# Patient Record
Sex: Female | Born: 1965 | Race: Black or African American | Hispanic: No | Marital: Married | Smoking: Current every day smoker
Health system: Southern US, Community
[De-identification: ages and names within clinical notes are randomized; demographics above are authoritative.]

## PROBLEM LIST (undated history)

## (undated) DIAGNOSIS — F17200 Nicotine dependence, unspecified, uncomplicated: Secondary | ICD-10-CM

## (undated) DIAGNOSIS — D649 Anemia, unspecified: Secondary | ICD-10-CM

## (undated) DIAGNOSIS — K219 Gastro-esophageal reflux disease without esophagitis: Secondary | ICD-10-CM

## (undated) DIAGNOSIS — Z5189 Encounter for other specified aftercare: Secondary | ICD-10-CM

## (undated) HISTORY — DX: Anemia, unspecified: D64.9

## (undated) HISTORY — DX: Nicotine dependence, unspecified, uncomplicated: F17.200

## (undated) HISTORY — DX: Encounter for other specified aftercare: Z51.89

---
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
## 2005-09-20 ENCOUNTER — Emergency Department (HOSPITAL_COMMUNITY): Admission: EM | Admit: 2005-09-20 | Discharge: 2005-09-20 | Payer: Self-pay | Admitting: Family Medicine

## 2007-03-17 ENCOUNTER — Emergency Department (HOSPITAL_COMMUNITY): Admission: EM | Admit: 2007-03-17 | Discharge: 2007-03-17 | Payer: Self-pay | Admitting: Family Medicine

## 2010-05-28 ENCOUNTER — Emergency Department (HOSPITAL_COMMUNITY): Admission: EM | Admit: 2010-05-28 | Discharge: 2010-05-28 | Payer: Self-pay | Admitting: Family Medicine

## 2013-01-05 ENCOUNTER — Ambulatory Visit (INDEPENDENT_AMBULATORY_CARE_PROVIDER_SITE_OTHER): Payer: BC Managed Care – PPO | Admitting: Gynecology

## 2013-01-05 ENCOUNTER — Encounter: Payer: Self-pay | Admitting: Gynecology

## 2013-01-05 VITALS — BP 120/74 | HR 80 | Resp 18 | Ht 70.5 in | Wt 163.0 lb

## 2013-01-05 DIAGNOSIS — Z0000 Encounter for general adult medical examination without abnormal findings: Secondary | ICD-10-CM

## 2013-01-05 DIAGNOSIS — F17200 Nicotine dependence, unspecified, uncomplicated: Secondary | ICD-10-CM | POA: Insufficient documentation

## 2013-01-05 DIAGNOSIS — N920 Excessive and frequent menstruation with regular cycle: Secondary | ICD-10-CM

## 2013-01-05 DIAGNOSIS — D649 Anemia, unspecified: Secondary | ICD-10-CM

## 2013-01-05 DIAGNOSIS — Z01419 Encounter for gynecological examination (general) (routine) without abnormal findings: Secondary | ICD-10-CM

## 2013-01-05 LAB — POCT URINALYSIS DIPSTICK: Blood, UA: 2

## 2013-01-05 NOTE — Progress Notes (Signed)
47 y.o. Single African American female   [phone] here for annual exam. Pt is currently sexually active.  Pt reports bleeding since May 8th, started with normal cycle but has continued.  On onset changing pads q1H, clots cherry tomato size to small apple size.  Pt denies history of fibroids, denies history of sickle cell or thalassemia.  Cycles usually monthly.  Pt reports having month long cycle earlier this year but usually flow is 7d.  Pt denies bleeding after sex, currently not sexually active.  Pt denies any shortness of breath, fatigue at work-heavy lifting.  Pt is taking MVI and iron, 3-32m  Patient's last menstrual period was 11/30/2012.          Sexually active: yes  The current method of family planning is none.    Exercising: no  regularly Last pap: 4 years ago; has had a abnormal when she was a teenager. Alcohol: no Tobacco: 6-7 cigs/qd BSE: sometimes  Hgb: 5.1 Urine Blood 2 (on cycle)  ; Leuks 1    No health maintenance topics applied.  Family History  Problem Relation Age of Onset  . Hypertension Mother   . Heart Problems Father     Patient Active Problem List   Diagnosis Date Noted  . Smoker     Past Medical History  Diagnosis Date  . Smoker   . Anemia   . Blood transfusion without reported diagnosis     No past surgical history on file.  Allergies: Review of patient's allergies indicates no known allergies.  Current Outpatient Prescriptions  Medication Sig Dispense Refill  . IRON PO Take by mouth.      . Multiple Vitamins-Minerals (MULTIVITAMIN PO) Take by mouth.       No current facility-administered medications for this visit.    ROS: Pertinent items are noted in HPI.  Exam:    BP 120/74  Pulse 80  Resp 18  Ht 5' 10.5" (1.791 m)  Wt 163 lb (73.936 kg)  BMI 23.05 kg/m2  LMP 11/30/2012 Weight change: @WEIGHTCHANGE @ Last 3 height recordings:  Ht Readings from Last 3 Encounters:  01/05/13 5' 10.5" (1.791 m)   General appearance: alert,  cooperative and appears stated age Head: Normocephalic, without obvious abnormality, atraumatic Neck: no adenopathy, no carotid bruit, no JVD, supple, symmetrical, trachea midline and thyroid not enlarged, symmetric, no tenderness/mass/nodules Lungs: clear to auscultation bilaterally Breasts: normal appearance, no masses or tenderness Heart: regular rate and rhythm, S1, S2 normal, no murmur, click, rub or gallop Abdomen: soft, non-tender; bowel sounds normal; no masses,  no organomegaly Extremities: extremities normal, atraumatic, no cyanosis or edema Skin: Skin color, texture, turgor normal. No rashes or lesions Lymph nodes: Cervical, supraclavicular, and axillary nodes normal. no inguinal nodes palpated Neurologic: Grossly normal   Pelvic: External genitalia:  no lesions              Urethra: normal appearing urethra with no masses, tenderness or lesions              Bartholins and Skenes: normal                 Vagina: normal appearing vagina with normal color and discharge, no lesions              Cervix: normal appearance and scant blood              Pap taken: yes        Bimanual Exam:  Uterus:  uterus is normal size, shape,  consistency and nontender                                      Adnexa:    normal adnexa in size, nontender and no masses                                      Rectovaginal: Confirms                                      Anus:  normal sphincter tone, no lesions  A: menorrhagia, severe anemia Recommend proceeding with EMB today based on Hb although pt without sx.     P: EMB done-see note below mammogram pap smear with HRHPV return annually or prn   An After Visit Summary was printed and given to the patient.  Endometrial Biopsy Procedure Note   Procedure Details   The risks (including infection, bleeding, pain, and uterine perforation) and benefits of the procedure were addressed, consent given  cervix was cleansed with betadine, xylocaine jelly was  placed in endocervix and anterior lip.  The biopsy pipelle was advanced, without dilation.  Uterus sounded to 8cm, a large amount of tissue was obtained on single pass. Pt tolerated procedure well. Tissue to pathology.  The patient was advised to call for any fever or for prolonged or severe pain or bleeding. She was advised to use OTC pain relievers as needed for mild to moderate pain. She was advised to avoid vaginal intercourse for 48 hours or until the bleeding has completely stopped. We recommend pt continue her MVI and increase iron to BID, we will contact with results An after visit summary was provided to the patient.

## 2013-01-05 NOTE — Patient Instructions (Signed)

## 2013-01-06 LAB — CBC
HCT: 19.6 % — ABNORMAL LOW (ref 36.0–46.0)
MCV: 58 fL — ABNORMAL LOW (ref 78.0–100.0)
Platelets: 162 10*3/uL (ref 150–400)
RBC: 3.44 MIL/uL — ABNORMAL LOW (ref 3.87–5.11)
WBC: 5.6 10*3/uL (ref 4.0–10.5)

## 2013-01-06 LAB — VITAMIN B12: Vitamin B-12: 336 pg/mL (ref 211–911)

## 2013-01-06 LAB — IRON: Iron: <10 ug/dL — ABNORMAL LOW (ref 42–145)

## 2013-01-08 ENCOUNTER — Other Ambulatory Visit: Payer: Self-pay | Admitting: *Deleted

## 2013-01-09 LAB — IPS PAP TEST WITH HPV

## 2013-01-10 ENCOUNTER — Telehealth: Payer: Self-pay | Admitting: Orthopedic Surgery

## 2013-01-10 MED ORDER — METRONIDAZOLE 500 MG PO TABS: 500.0000 mg | ORAL_TABLET | Freq: Two times a day (BID) | ORAL | Status: DC

## 2013-01-10 MED ORDER — METOCLOPRAMIDE HCL 10 MG PO TABS: 10.0000 mg | ORAL_TABLET | Freq: Four times a day (QID) | ORAL | Status: DC | PRN

## 2013-01-10 NOTE — Telephone Encounter (Signed)
Call to pt to give Pap results. Pt at work and will take a break and call me back.

## 2013-01-10 NOTE — Telephone Encounter (Signed)
Spoke with pt about Pap results showing trichomonas infection. Instructed pt we would call in Flagyl 500 mg tablets and she would need to take 4 of them all at once. Pt also to take reglan, a nausea medicine, 30 minutes before taking the Flagyl. Pt can also take reglan every 6 hours afterwards. Pt to inform any partners of the infection so they can get treated. Pt to RTO in 2 weeks for TOC. Appt made for 01-22-13 at 11:45 for TOC. Answered questions about infection. Pt agreeable.

## 2013-01-10 NOTE — Telephone Encounter (Signed)
Message copied by Alfredo Batty on Wed Jan 10, 2013  9:36 AM ------      Message from: Lorraine Lax      Created: Tue Jan 09, 2013  3:01 PM       Routed to triage            02 recall 01/11/14 ------

## 2013-01-10 NOTE — Telephone Encounter (Signed)
Message copied by Alfredo Batty on Wed Jan 10, 2013 10:24 AM ------      Message from: Lorraine Lax      Created: Tue Jan 09, 2013  3:01 PM       Routed to triage            02 recall 01/11/14 ------

## 2013-01-11 ENCOUNTER — Encounter: Payer: Self-pay | Admitting: Gynecology

## 2013-01-12 LAB — IPS CERVICAL/ECC/EMB/VULVAR/VAGINAL BIOPSY

## 2013-01-18 ENCOUNTER — Telehealth: Payer: Self-pay | Admitting: Gynecology

## 2013-01-22 ENCOUNTER — Ambulatory Visit: Payer: BC Managed Care – PPO | Admitting: Gynecology

## 2013-01-23 NOTE — Telephone Encounter (Signed)
[facility] called to let Kennon Rounds know they are faxing this patients completed paperwork over.

## 2013-02-01 ENCOUNTER — Encounter: Payer: Self-pay | Admitting: Gynecology

## 2013-02-01 ENCOUNTER — Ambulatory Visit (INDEPENDENT_AMBULATORY_CARE_PROVIDER_SITE_OTHER): Payer: BC Managed Care – PPO | Admitting: Gynecology

## 2013-02-01 ENCOUNTER — Telehealth: Payer: Self-pay | Admitting: *Deleted

## 2013-02-01 VITALS — BP 108/60 | HR 66 | Resp 16 | Wt 163.0 lb

## 2013-02-01 DIAGNOSIS — Z202 Contact with and (suspected) exposure to infections with a predominantly sexual mode of transmission: Secondary | ICD-10-CM

## 2013-02-01 DIAGNOSIS — N920 Excessive and frequent menstruation with regular cycle: Secondary | ICD-10-CM

## 2013-02-01 DIAGNOSIS — Z2089 Contact with and (suspected) exposure to other communicable diseases: Secondary | ICD-10-CM

## 2013-02-01 DIAGNOSIS — D649 Anemia, unspecified: Secondary | ICD-10-CM

## 2013-02-01 MED ORDER — METRONIDAZOLE 500 MG PO TABS: 500.0000 mg | ORAL_TABLET | Freq: Two times a day (BID) | ORAL | Status: DC

## 2013-02-01 MED ORDER — METOCLOPRAMIDE HCL 10 MG PO TABS: 10.0000 mg | ORAL_TABLET | Freq: Four times a day (QID) | ORAL | Status: DC | PRN

## 2013-02-01 NOTE — Addendum Note (Signed)
Addended by: Lorraine Lax on: 02/01/2013 04:49 PM   Modules accepted: Orders

## 2013-02-01 NOTE — Patient Instructions (Signed)
Reexposure to trichomonas, partner needs to be treated as well Take reglan before flgyl 4 tablets at once

## 2013-02-01 NOTE — Telephone Encounter (Signed)
Return call to patient.  She states she has called has been approved with [employer] for $10 copay.  Given phone number to [facility] to arrange payment and shipment.  Instructed to call with menses so we can schedule injection, Menses is due in approx. two weeks.  Encouraged to continue iron supplement and discussed avoid taking iron with calcium rich foods.  Take with OJ if able to as Vit C can increase absorption.  Extra fluids and slow position changes. Advised she can call back if any additional assistance needed with Lupron.

## 2013-02-01 NOTE — Telephone Encounter (Signed)
Call to [facility] to check on status of Lupron order.  Transferred to [employer] and spoke to [place], [phone].  They have not received return call from patient with permission to ship.  Patient is here in office so she spoke to [place] and was told her copay for medication was $580 which patient can not afford.  She would also have to repeat that copay in 3 months. Patient was given information to call for copay assistance.  She will discuss options with dr Farrel Gobble.

## 2013-02-01 NOTE — Progress Notes (Signed)
Subjective:     Patient ID: Ebony Ward, female   DOB: April 25, 1966, 47 y.o.   MRN: [id number]  HPI Comments: Pt here for test of cure for trichomonal infection, pt tolerated flagyl after taking reglan pre-treatment.  Pt reports that her partner was not treated and that they had been sexually active since her treatment.  He is here with her and had questions regarding her diagnosis.   In addition, pt had not been in contact with [employer] regarding her Lupron, she is taking her iron as recommended and denies any symptoms of shortness of breath, fatigue or chest pain.  She has not had her menses since we initially saw her   Exposure to STD  The patient's pertinent negatives include no discharge, dyspareunia, dysuria, genital itching, genital lesions, genital rash or pelvic pain. Pertinent negatives include no abdominal pain, fever, genital odor or urinary frequency. The treatment provided mild relief. Risk factors include history of STDs.     Review of Systems  Constitutional: Negative for fever, activity change and fatigue.  Cardiovascular: Negative for chest pain.  Gastrointestinal: Negative for abdominal pain.  Genitourinary: Negative for dysuria, frequency, vaginal bleeding, pelvic pain and dyspareunia.       Objective:   Physical Exam  Constitutional: She is oriented to person, place, and time. She appears well-developed and well-nourished.  Neurological: She is alert and oriented to person, place, and time. She has normal reflexes.  Skin: Skin is warm and dry.       Assessment:     menorrhagia with severe anemia-asymptomatic Exposure to trichomonas     Plan:     Discussed with pt and boyfriend regarding symptoms and treatment for trichomonas, both partners need treatment and test of cure before resuming sexual activity, as they have had sex, we will retreat in lieu of test of cure, but she is to return for test of cure after 2w, she is agreeable.  Questions from both were  addressed Menorrhagia with anemia-pt will continue to take her iron, we will check a retic count and CBC at her follow up visit, we reviewed risks of severe anemia and asked pt to call if she develops symptoms, she is agreeable, pt is aware that transfusion is an option for her but is temporary fix.  We suggested she contact [employer] regarding assistance for the lupron as she cannot afford the co-pay, she also spoke with Kennon Rounds regarding this as well.   length of time spent discussing both STD's and anemia >73m,  >50% face to face

## 2013-02-01 NOTE — Telephone Encounter (Signed)
Patient calling to let us know she has "gotten the copay situation worked out." Needs to know how to proceed.

## 2013-02-02 ENCOUNTER — Telehealth: Payer: Self-pay | Admitting: *Deleted

## 2013-02-02 MED ORDER — METRONIDAZOLE 500 MG PO TABS: ORAL_TABLET | ORAL | Status: DC

## 2013-02-02 MED ORDER — METOCLOPRAMIDE HCL 10 MG PO TABS: ORAL_TABLET | ORAL | Status: DC

## 2013-02-02 NOTE — Addendum Note (Signed)
Addended by: Lorraine Lax on: 02/02/2013 08:32 AM   Modules accepted: Orders

## 2013-02-02 NOTE — Telephone Encounter (Signed)
Kennon Rounds . See note below . Call from Ms. Mack this morning concerning her Lupron. Dr. Farrel Gobble states aware of this also.

## 2013-02-02 NOTE — Telephone Encounter (Signed)
Patient called to let Dr. Farrel Gobble know she had contacted the company for her Lupron and was told she will have assistance with her co-pay concerning Lupron . States her co-pay for Lupron  now will be $10.00 instead of $500.00. States she was told that our office needed to call [facility] to have medicine shipped to our office.  Patient states she went to get her Rx 's that Dr. Farrel Gobble and her had talked about her pharmacy [facility] [address] in [place] and pharmacy told her would need to call our office to confirm directions on Rx before they could fill this.  Ms. Cherre Huger says to call her . She is anxious now to get Lupron due to her low iron and blood test results.

## 2013-02-02 NOTE — Addendum Note (Signed)
Addended by: Lorraine Lax on: 02/02/2013 09:16 AM   Modules accepted: Orders

## 2013-02-09 ENCOUNTER — Other Ambulatory Visit (INDEPENDENT_AMBULATORY_CARE_PROVIDER_SITE_OTHER): Payer: BC Managed Care – PPO

## 2013-02-09 ENCOUNTER — Telehealth: Payer: Self-pay | Admitting: *Deleted

## 2013-02-09 DIAGNOSIS — D649 Anemia, unspecified: Secondary | ICD-10-CM

## 2013-02-09 DIAGNOSIS — N920 Excessive and frequent menstruation with regular cycle: Secondary | ICD-10-CM

## 2013-02-09 LAB — CBC
Hemoglobin: 9.7 g/dL — ABNORMAL LOW (ref 12.0–15.0)
Platelets: 160 10*3/uL (ref 150–400)
RBC: 4.57 MIL/uL (ref 3.87–5.11)

## 2013-02-09 LAB — RETICULOCYTES
ABS Retic: 36.6 10*3/uL (ref 19.0–186.0)
RBC.: 4.57 MIL/uL (ref 3.87–5.11)
Retic Ct Pct: 0.8 % (ref 0.4–2.3)

## 2013-02-09 NOTE — Telephone Encounter (Signed)
Call to [facility] to follow up on shipment of Lupron medication.  Per Dondra Prader, they are ready to ship and medication should arrive 02-13-13.

## 2013-02-09 NOTE — Telephone Encounter (Signed)
See next phone note.

## 2013-02-09 NOTE — Telephone Encounter (Signed)
Patient notified that Lupron scheduled for delivery on Tuesday 02-13-13.  Menses due anytime, no contraception so knows to call with menses to schedule injection.

## 2013-02-13 ENCOUNTER — Telehealth: Payer: Self-pay | Admitting: *Deleted

## 2013-02-13 ENCOUNTER — Ambulatory Visit (INDEPENDENT_AMBULATORY_CARE_PROVIDER_SITE_OTHER): Payer: BC Managed Care – PPO

## 2013-02-13 ENCOUNTER — Ambulatory Visit (INDEPENDENT_AMBULATORY_CARE_PROVIDER_SITE_OTHER): Payer: BC Managed Care – PPO | Admitting: Gynecology

## 2013-02-13 DIAGNOSIS — N8300 Follicular cyst of ovary, unspecified side: Secondary | ICD-10-CM

## 2013-02-13 DIAGNOSIS — D649 Anemia, unspecified: Secondary | ICD-10-CM

## 2013-02-13 DIAGNOSIS — R9389 Abnormal findings on diagnostic imaging of other specified body structures: Secondary | ICD-10-CM

## 2013-02-13 DIAGNOSIS — N920 Excessive and frequent menstruation with regular cycle: Secondary | ICD-10-CM

## 2013-02-13 NOTE — Patient Instructions (Addendum)
Total Laparoscopic Hysterectomy A total laparoscopic hysterectomy is a minimally invasive surgery to remove your uterus and cervix. This surgery is performed by making several small cuts (incisions) in your abdomen. It can also be done with a thin, lighted tube (laparoscope) inserted into 2 small incisions in the lower abdomen. Your fallopian tubes and ovaries can be removed (bilateral salpingo-oopherectomy) during this surgery as well.If a total laparoscopic hysterectomy is started and it is not safe to continue, the laparoscopic surgery will be converted to an open abdominal surgery. You will not have menstrual periods or be able to get pregnant after having this surgery. If a bilateral salpingo-oopherectomy was performed before menopause, you will go through a sudden (abrupt) menopause. This can be helped with hormone medicines. Benefits of minimally invasive surgery include:  Less pain.  Less risk of blood loss.  Less risk of infection.  Quicker return to normal activities.  Usually a 1 night stay in the hospital.  Overall patient satisfaction. LET YOUR CAREGIVER KNOW ABOUT:  Any history of abnormal Pap tests.  Allergies to food or medicine.  Medicines taken, including vitamins, herbs, eyedrops, over-the-counter medicines, and creams.  Use of steroids (by mouth or creams).  Previous problems with anesthetics or numbing medicines.  Watch youtube robotic hysterectomy  History of bleeding problems or blood clots.  Previous surgery.  Other health problems, including diabetes and kidney problems.  Desire for future fertility.  Any infections or colds you may have developed.  Symptoms of irregular or heavy periods, weight loss, or urinary or bowel changes. RISKS AND COMPLICATIONS   Bleeding.  Blood clots in the legs or lung.  Infection.  Injury to surrounding organs.  Problems with anesthesia.  Early menopause symptoms (hot flashes, night sweats, insomnia).  Risk  of conversion to an open abdominal incision. BEFORE THE PROCEDURE  Ask your caregiver about changing or stopping your regular medicines.  Do not take aspirin or blood thinners (anticoagulants) for 1 week before the surgery, or as told by your caregiver.  Do not eat or drink anything for 8 hours before the surgery, or as told by your caregiver.  Quit smoking if you smoke.  Arrange for a ride home after surgery and for someone to help you at home during recovery. PROCEDURE   You will be given antibiotic medicine.  An intravenous (IV) line will be placed in your arm. You will be given medicine to make you sleep (general anesthetic).  A gas (carbon dioxide) will be used to inflate your abdomen. This will allow your surgeon to look inside your abdomen, perform your surgery, and treat any other problems found if necessary.  Three or four small incisions (often less than  inch) will be made in your abdomen. One of these incisions will be made in the area of your belly button (navel). The laparoscope will be inserted into the incision. Your surgeon will look through the laparoscope while doing your procedure.  Other surgical instruments will be inserted through the other incisions.  The uterus may be removed through the vagina or cut into small pieces and removed through the small incisions.  Your incisions will be closed. AFTER THE PROCEDURE  The gas will be released from inside your abdomen.  You will be taken to the recovery area where a nurse will watch and check your progress. Once you are awake, stable, and taking fluids well, without other problems, you will return to your room or be allowed to go home.  There is usually minimal  discomfort following the surgery because the incisions are so small.  You will be given pain medicine while you are in the hospital and for when you go home.  Try to have someone with you the first 3 to 5 days after you go home.  Follow up with your  surgeon in 2 to 4 weeks after surgery to evaluate your progress. Document Released: 05/09/2007 Document Revised: 10/04/2011 Document Reviewed: 02/26/2011 [facility] Patient Information 2014 [place], [place].

## 2013-02-13 NOTE — Telephone Encounter (Signed)
Message copied by Lorraine Lax on Tue Feb 13, 2013 10:21 AM ------      Message from: Douglass Rivers      Created: Sun Feb 11, 2013 11:07 AM       Having a fantastic response to the iron!  Keep it up for now ------

## 2013-02-13 NOTE — Progress Notes (Signed)
Pt here for u/s to evaluate uterus due to severe menorrhagia and significant anemia.  Hb ws 5.2 is now 9.7 after 1 month of iron and MVI.  Pt overall is feeling much better.  She has not had a menses since we first saw her but is due now.  We discussed the u/s findings, her uterus is remarkable only for her cs scar and a fundal echogenic focus that was noted to be avacular, endometrium is trilayered, ovaries were normal. we offered her a SHG based on the foci but she declines as we had a negative EMB and pt is planning on hysterectomy, she is starting to feel better with the improvement of her hb. We discussed perhaps not needing the lupron as she responded to the iron so well, I have asked her to call after the upcoming cycle and she is agreeable. We briefly discussed a robotic hysterectomy with her and she was given information and referred to youtube for video, I have asked her to come in for a pre-op consult and we can discuss any questions she may have at that time and she is agreeable.  We reviewed the anticipated time out out work based on her job and she would like to be able to schedule in September.  If her cycle is tolerable, we can consider placing her on progestins until surgery and she is agreeable Length of time discussing treatment 20m, >50% face to face

## 2013-02-13 NOTE — Telephone Encounter (Signed)
LM with female that order has arrive.

## 2013-02-13 NOTE — Telephone Encounter (Signed)
Left Message To Call Back regarding lab results.

## 2013-02-14 ENCOUNTER — Telehealth: Payer: Self-pay | Admitting: *Deleted

## 2013-02-14 NOTE — Telephone Encounter (Signed)
Call to patient to advise of estimated OOP cost for surgery, Female states she is not home. LMTCB.

## 2013-02-16 ENCOUNTER — Encounter: Payer: Self-pay | Admitting: Gynecology

## 2013-02-16 ENCOUNTER — Ambulatory Visit (INDEPENDENT_AMBULATORY_CARE_PROVIDER_SITE_OTHER): Payer: BC Managed Care – PPO | Admitting: Gynecology

## 2013-02-16 VITALS — BP 90/56 | HR 80 | Ht 70.5 in | Wt 165.0 lb

## 2013-02-16 DIAGNOSIS — A599 Trichomoniasis, unspecified: Secondary | ICD-10-CM

## 2013-02-16 LAB — POCT WET PREP (WET MOUNT)

## 2013-02-16 NOTE — Progress Notes (Signed)
Subjective:     Patient ID: Ebony Ward, female   DOB: 1966/05/21, 47 y.o.   MRN: [id number]  HPI Comments: Here for test of cure for trichomonas, boyfriend now has been treated, pt reports vaginal discharge is better, no odor noted, no fever or chills    Review of Systems  All other systems reviewed and are negative.       Objective:   Physical Exam  Constitutional: She is oriented to person, place, and time. She appears well-developed and well-nourished.  Genitourinary: Uterus normal. There is no rash on the left labia. Uterus is not tender. Cervix exhibits no motion tenderness, no discharge and no friability. Right adnexum displays no mass, no tenderness and no fullness. Left adnexum displays no mass, no tenderness and no fullness. No vaginal discharge found.  Neurological: She is alert and oriented to person, place, and time.   Wet prep done    Assessment:     Test of cure for trichomonas     Plan:     Minimal bacterial vaginosis- pt without symptoms, elects not to treat, will call if symptoms develop

## 2013-02-19 ENCOUNTER — Telehealth: Payer: Self-pay | Admitting: Gynecology

## 2013-02-19 NOTE — Telephone Encounter (Signed)
LMTCB to discuss insurance benefits for surgery.  °

## 2013-02-19 NOTE — Telephone Encounter (Signed)
Patient was notified at U/S visit 02/13/13

## 2013-02-20 NOTE — Telephone Encounter (Signed)
See next office visit note. Dr lathrop discussed this with patient.

## 2013-02-22 NOTE — Telephone Encounter (Signed)
Patient was returning call to [facility]. I spoke with her about her insurance benefits for surgery. Told her I would leave a message for Kennon Rounds. She wants to make sure that there is nothing else she needs to do other than wait for her cycle for her Lupron injection.

## 2013-03-14 NOTE — Telephone Encounter (Signed)
Patient is to return call . LMTRC on CB#VM to call office to let know how she is feeling. Is she still taking Iron ? Having any symptoms of anemia.?

## 2013-03-14 NOTE — Telephone Encounter (Signed)
Patient last OV 02/13/2013 Menorrhagia, PUS. Patient calling today to let Dr. Farrel Gobble know menses started and was told might need LUPRON injection. Patient states menses is still light and some abd. Cramping. Patient states she is still on Iron medication and feels much better. Please advise if still needing LUPRON injection to schedule to come in.

## 2013-03-14 NOTE — Telephone Encounter (Signed)
Patient was told to call when menses starts. Patient says she needs a shot.

## 2013-03-16 ENCOUNTER — Other Ambulatory Visit: Payer: Self-pay | Admitting: Gynecology

## 2013-03-16 ENCOUNTER — Ambulatory Visit (INDEPENDENT_AMBULATORY_CARE_PROVIDER_SITE_OTHER): Payer: BC Managed Care – PPO | Admitting: *Deleted

## 2013-03-16 VITALS — BP 130/68 | HR 82 | Resp 14 | Ht 70.0 in | Wt 166.0 lb

## 2013-03-16 DIAGNOSIS — N926 Irregular menstruation, unspecified: Secondary | ICD-10-CM

## 2013-03-16 DIAGNOSIS — N920 Excessive and frequent menstruation with regular cycle: Secondary | ICD-10-CM

## 2013-03-16 MED ORDER — LEUPROLIDE ACETATE 3.75 MG IM KIT: 3.7500 mg | PACK | INTRAMUSCULAR | Status: DC

## 2013-03-16 MED ORDER — LEUPROLIDE ACETATE (3 MONTH) 11.25 MG IM KIT
11.2500 mg | PACK | Freq: Once | INTRAMUSCULAR | Status: AC
  Administered 2013-03-16: 11.25 mg via INTRAMUSCULAR

## 2013-03-16 NOTE — Progress Notes (Signed)
Patient tolerated injection well and is aware to return in 3 months for her next Lupron. (see Documentation)

## 2013-03-16 NOTE — Progress Notes (Signed)
Pt called with menses, please give lupron today

## 2013-03-16 NOTE — Patient Instructions (Addendum)
Patient needs to make a appointment for 3 months for next Lupron

## 2013-03-16 NOTE — Progress Notes (Signed)
Patient coming in at 1:45 for Depo Lupron.

## 2013-04-06 ENCOUNTER — Telehealth: Payer: Self-pay | Admitting: *Deleted

## 2013-04-06 NOTE — Telephone Encounter (Signed)
Call to patient to discuss options for surgery dates. LMTCB.

## 2013-04-11 NOTE — Telephone Encounter (Signed)
See next OV note regarding Lupron.

## 2013-04-11 NOTE — Telephone Encounter (Signed)
Spoke to patient regarding surgical date options.  Patient really needs 3 weeks notice due to work.  Will call her back once conformed with hospital.  Currently scheduled for 05-09-13 but trying to move up 1 week.

## 2013-04-13 NOTE — Telephone Encounter (Signed)
Patient notified that surgery is scheduled for 05-09-17 at 0830 at [facility]. Instructions reviewed and pre/post op appts scheduled. Patient to get FMLA forms sent to office for completion. Gave the phone number to return call to PAT nurses.

## 2013-04-27 ENCOUNTER — Encounter: Payer: Self-pay | Admitting: Gynecology

## 2013-04-27 ENCOUNTER — Ambulatory Visit (INDEPENDENT_AMBULATORY_CARE_PROVIDER_SITE_OTHER): Payer: BC Managed Care – PPO | Admitting: Gynecology

## 2013-04-27 VITALS — BP 110/68 | HR 68 | Ht 70.5 in | Wt 169.0 lb

## 2013-04-27 DIAGNOSIS — N920 Excessive and frequent menstruation with regular cycle: Secondary | ICD-10-CM

## 2013-04-27 DIAGNOSIS — D649 Anemia, unspecified: Secondary | ICD-10-CM

## 2013-04-27 LAB — CBC
MCHC: 33.7 g/dL (ref 30.0–36.0)
MCV: 78.1 fL (ref 78.0–100.0)
Platelets: 193 10*3/uL (ref 150–400)
RDW: 18.4 % — ABNORMAL HIGH (ref 11.5–15.5)
WBC: 6.9 10*3/uL (ref 4.0–10.5)

## 2013-04-27 MED ORDER — HYDROMORPHONE HCL 2 MG PO TABS: 2.0000 mg | ORAL_TABLET | ORAL | Status: DC | PRN

## 2013-04-27 MED ORDER — CELECOXIB 200 MG PO CAPS: ORAL_CAPSULE | ORAL | Status: DC

## 2013-04-27 NOTE — Progress Notes (Signed)
47 y.o.SingleAfrican American [id number] female here for consideration for robotically assisted TLH  with Bilateral salpingectomy and without removal of ovaries.    She complains of menorrhagia. Pt treated with depo-lupron for menorrhagia and has responded well.  Last cycle very light but flowed for 1w.  Pt reorts energy is better. PUS on 02/14/2103 showed the uterus to be enlarged, 9.8x7.x5.5cm and contain 0 fibroids,  Adnexa Normal.  Endometrial biopsy on 01/06/2103 was PORTIONS OF NONPHASIC ENDOMETRIUM WITH BREAKDOWN  -NO HYPERPLASIA OR CARCINOMA IDENTIFIED        The current method of family planning is condoms    Hormones:lurpon   reports that she has been smoking Cigarettes.  She has been smoking about 0.00 packs per day. She has never used smokeless tobacco. She reports that she uses illicit drugs (Marijuana). She reports that she does not drink alcohol.  @CHLEC1NM@  Health Maintenance  Topic Date Due  . Tetanus/tdap  10/07/1986  . Influenza Vaccine  02/23/2013  . Pap Smear  01/06/2016    Family History  Problem Relation Age of Onset  . Hypertension Mother   . Heart Problems Father     Patient Active Problem List   Diagnosis Date Noted  . Menorrhagia 01/05/2013  . Anemia 01/05/2013  . Smoker     Past Medical History  Diagnosis Date  . Smoker   . Anemia   . Blood transfusion without reported diagnosis     History reviewed. No pertinent past surgical history.  Allergies: @RRALLERGIES@  Current Outpatient Prescriptions  Medication Sig Dispense Refill  . IRON PO Take by mouth.      . LUPRON DEPOT 11.25 MG injection Inject 11.25 mg into the muscle every 3 (three) months.       . Multiple Vitamins-Minerals (MULTIVITAMIN PO) Take by mouth.      . omeprazole (PRILOSEC) 20 MG capsule Take 20 mg by mouth as needed.       No current facility-administered medications for this visit.      Exam:    BP 110/68  Pulse 68  Ht 5' 10.5" (1.791 m)  Wt 169 lb (76.658 kg)   BMI 23.9 kg/m2  LMP 04/09/2013  General appearance: alert, cooperative and appears stated age Head: Normocephalic, without obvious abnormality, atraumatic Lungs: clear to auscultation bilaterally Heart: regular rate and rhythm, S1, S2 normal, no murmur, click, rub or gallop Abdomen: soft, non-tender; bowel sounds normal; no masses,  no organomegaly Extremities: extremities normal, atraumatic, no cyanosis or edema Lymph nodes: Cervical, supraclavicular, and axillary nodes normal. no inguinal nodes palpated Neurologic: Grossly normal   Pelvic: External genitalia:  no lesions              Bartholins and Skenes: normal                 Vagina: normal appearing vagina with normal color and discharge, no lesions              Cervix: normal appearance                     Bimanual Exam:  Uterus:  uterus is normal size, shape, consistency and nontender                                      Adnexa:    normal adnexa in size, nontender and no masses                                        Rectovaginal: Deferred                                      Anus:  defer exam   A/P:  The planned procedure was discussed with the patient.  Pre-op instructions, hospital course, and post-op instructions reviewed.  Post op instruction booklet given and reviewed.  Risks and possible complications discussed, including but not limited to, bleeding and possible transfusion; infection; anesthesia complications; injury to visceral organ requiring further surgery, either immediately or delayed; wound complications including infection, blood collections, and bowel herniation; allergic reactions; swelling of the face due to prolonged positioning;  VTE or air emboli; nerve injury related to prolonged positioning; even death.    The patient was given the opportunity to watch the informed consent video on hysterectomy.  Her questions were invited and answered.  The patient states she understands the risks and possible complications,  and wishes to proceed as planned.  Consent form signed.  She was instructed to take two 200 mg celebrex capsules with a small sip of water the morning of the procedure, and then to take one bid on POD 1 and 2.  Rx given dilaudid  2mg #20 for post-op Ready for surgery.   CBC today Encouraged to stop smoking Condom use stressed  

## 2013-04-27 NOTE — Patient Instructions (Signed)
Take 2xcelebrex the morning of surgery with sip of water Then take 1 pill twice a day Can mix with dilaudid for pain thereafter. No NSADIS-motrin, aleve or aspirin. Clear liquids the day before and suppository the night before and morning of

## 2013-05-03 ENCOUNTER — Encounter (HOSPITAL_COMMUNITY): Payer: Self-pay | Admitting: Pharmacist

## 2013-05-03 NOTE — Patient Instructions (Addendum)
Your procedure is scheduled on: 05/09/2013  Enter through the Main Entrance of [facility] at: 0700AM  Pick up the phone at the desk and dial [phone].  Call this number if you have problems the morning of surgery: [phone].  Remember: Do NOT eat food: AFTER MIDNIGHT 05/08/2013 Do NOT drink clear liquids after:AFTER MIDNIGHT 05/08/2013 Take these medicines the morning of surgery with a SIP OF WATER: OMEPRAZOLE  Do NOT wear jewelry (body piercing), make-up, or nail polish. Do NOT wear lotions, powders, or perfumes.  You may wear deoderant. Do NOT shave for 48 hours prior to surgery. Do NOT bring valuables to the hospital. Contacts, dentures, or bridgework may not be worn into surgery. Leave suitcase in car.  After surgery it may be brought to your room.  For patients admitted to the hospital, checkout time is 11:00 AM the day of discharge.

## 2013-05-04 ENCOUNTER — Encounter (HOSPITAL_COMMUNITY)
Admission: RE | Admit: 2013-05-04 | Discharge: 2013-05-04 | Disposition: A | Discharge status: Home / Self Care | Payer: BC Managed Care – PPO | Source: Ambulatory Visit | Attending: Gynecology | Admitting: Gynecology

## 2013-05-04 ENCOUNTER — Encounter (HOSPITAL_COMMUNITY): Payer: Self-pay

## 2013-05-04 DIAGNOSIS — Z01812 Encounter for preprocedural laboratory examination: Secondary | ICD-10-CM | POA: Insufficient documentation

## 2013-05-04 HISTORY — DX: Gastro-esophageal reflux disease without esophagitis: K21.9

## 2013-05-04 LAB — CBC
MCH: 26 pg (ref 26.0–34.0)
MCV: 80.5 fL (ref 78.0–100.0)
Platelets: 172 10*3/uL (ref 150–400)
RBC: 4.81 MIL/uL (ref 3.87–5.11)
RDW: 17.7 % — ABNORMAL HIGH (ref 11.5–15.5)
WBC: 6.5 10*3/uL (ref 4.0–10.5)

## 2013-05-04 LAB — TYPE AND SCREEN
ABO/RH(D): B POS
Antibody Screen: NEGATIVE

## 2013-05-04 LAB — ABO/RH: ABO/RH(D): B POS

## 2013-05-08 MED ORDER — DEXTROSE 5 % IV SOLN
2.0000 g | INTRAVENOUS | Status: AC
  Administered 2013-05-09: 2 g via INTRAVENOUS
  Filled 2013-05-08: qty 2

## 2013-05-08 MED ORDER — ACETAMINOPHEN 10 MG/ML IV SOLN
1000.0000 mg | Freq: Once | INTRAVENOUS | Status: DC
  Filled 2013-05-08: qty 100

## 2013-05-08 MED ORDER — ACETAMINOPHEN 10 MG/ML IV SOLN
1000.0000 mg | Freq: Once | INTRAVENOUS | Status: AC
  Administered 2013-05-09: 1000 mg via INTRAVENOUS
  Filled 2013-05-08: qty 100

## 2013-05-09 ENCOUNTER — Ambulatory Visit (HOSPITAL_COMMUNITY): Payer: BC Managed Care – PPO | Admitting: Anesthesiology

## 2013-05-09 ENCOUNTER — Encounter (HOSPITAL_COMMUNITY): Payer: Self-pay

## 2013-05-09 ENCOUNTER — Ambulatory Visit (HOSPITAL_COMMUNITY)
Admission: RE | Admit: 2013-05-09 | Discharge: 2013-05-10 | Disposition: A | Discharge status: Home / Self Care | Payer: BC Managed Care – PPO | Source: Ambulatory Visit | Attending: Gynecology | Admitting: Gynecology

## 2013-05-09 ENCOUNTER — Encounter (HOSPITAL_COMMUNITY): Payer: BC Managed Care – PPO | Admitting: Anesthesiology

## 2013-05-09 ENCOUNTER — Encounter (HOSPITAL_COMMUNITY)
Admission: RE | Disposition: A | Discharge status: Home / Self Care | Payer: Self-pay | Source: Ambulatory Visit | Attending: Gynecology

## 2013-05-09 DIAGNOSIS — N949 Unspecified condition associated with female genital organs and menstrual cycle: Secondary | ICD-10-CM

## 2013-05-09 DIAGNOSIS — N938 Other specified abnormal uterine and vaginal bleeding: Secondary | ICD-10-CM

## 2013-05-09 DIAGNOSIS — D649 Anemia, unspecified: Secondary | ICD-10-CM

## 2013-05-09 DIAGNOSIS — N838 Other noninflammatory disorders of ovary, fallopian tube and broad ligament: Secondary | ICD-10-CM | POA: Insufficient documentation

## 2013-05-09 DIAGNOSIS — F17200 Nicotine dependence, unspecified, uncomplicated: Secondary | ICD-10-CM | POA: Insufficient documentation

## 2013-05-09 DIAGNOSIS — Z9889 Other specified postprocedural states: Secondary | ICD-10-CM

## 2013-05-09 DIAGNOSIS — N920 Excessive and frequent menstruation with regular cycle: Secondary | ICD-10-CM | POA: Insufficient documentation

## 2013-05-09 DIAGNOSIS — D251 Intramural leiomyoma of uterus: Secondary | ICD-10-CM | POA: Insufficient documentation

## 2013-05-09 HISTORY — PX: BILATERAL SALPINGECTOMY: SHX5743

## 2013-05-09 HISTORY — PX: CYSTOSCOPY: SHX5120

## 2013-05-09 HISTORY — PX: ROBOTIC ASSISTED TOTAL HYSTERECTOMY: SHX6085

## 2013-05-09 LAB — TYPE AND SCREEN: Unit division: 0

## 2013-05-09 LAB — HCG, SERUM, QUALITATIVE: Preg, Serum: NEGATIVE

## 2013-05-09 SURGERY — ROBOTIC ASSISTED TOTAL HYSTERECTOMY
Anesthesia: General | Site: Bladder | Wound class: Clean Contaminated

## 2013-05-09 MED ORDER — PROPOFOL 10 MG/ML IV BOLUS
INTRAVENOUS | Status: DC | PRN
  Administered 2013-05-09: 200 mg via INTRAVENOUS

## 2013-05-09 MED ORDER — KETOROLAC TROMETHAMINE 30 MG/ML IJ SOLN
INTRAMUSCULAR | Status: AC
  Filled 2013-05-09: qty 1

## 2013-05-09 MED ORDER — CELECOXIB 200 MG PO CAPS
200.0000 mg | ORAL_CAPSULE | Freq: Two times a day (BID) | ORAL | Status: DC
  Administered 2013-05-10: 200 mg via ORAL
  Filled 2013-05-09: qty 1

## 2013-05-09 MED ORDER — INDIGOTINDISULFONATE SODIUM 8 MG/ML IJ SOLN
INTRAMUSCULAR | Status: DC | PRN
  Administered 2013-05-09: 3 mL via INTRAVENOUS

## 2013-05-09 MED ORDER — FENTANYL CITRATE 0.05 MG/ML IJ SOLN
INTRAMUSCULAR | Status: AC
  Filled 2013-05-09: qty 5

## 2013-05-09 MED ORDER — INFLUENZA VAC SPLIT QUAD 0.5 ML IM SUSP
0.5000 mL | INTRAMUSCULAR | Status: AC
  Administered 2013-05-10: 0.5 mL via INTRAMUSCULAR
  Filled 2013-05-09: qty 0.5

## 2013-05-09 MED ORDER — FENTANYL CITRATE 0.05 MG/ML IJ SOLN
INTRAMUSCULAR | Status: DC | PRN
  Administered 2013-05-09 (×4): 50 ug via INTRAVENOUS
  Administered 2013-05-09 (×3): 100 ug via INTRAVENOUS

## 2013-05-09 MED ORDER — SODIUM CHLORIDE 0.9 % IJ SOLN
INTRAMUSCULAR | Status: AC
  Filled 2013-05-09: qty 10

## 2013-05-09 MED ORDER — MENTHOL 3 MG MT LOZG: 1.0000 | LOZENGE | OROMUCOSAL | Status: DC | PRN

## 2013-05-09 MED ORDER — ROCURONIUM BROMIDE 50 MG/5ML IV SOLN
INTRAVENOUS | Status: AC
  Filled 2013-05-09: qty 1

## 2013-05-09 MED ORDER — ONDANSETRON HCL 4 MG/2ML IJ SOLN
INTRAMUSCULAR | Status: DC | PRN
  Administered 2013-05-09: 4 mg via INTRAMUSCULAR

## 2013-05-09 MED ORDER — SODIUM CHLORIDE 0.9 % IJ SOLN
INTRAMUSCULAR | Status: AC
  Filled 2013-05-09: qty 50

## 2013-05-09 MED ORDER — LACTATED RINGERS IR SOLN
Status: DC | PRN
  Administered 2013-05-09: 3000 mL

## 2013-05-09 MED ORDER — LACTATED RINGERS IV SOLN
INTRAVENOUS | Status: DC
  Administered 2013-05-09: 08:00:00 via INTRAVENOUS

## 2013-05-09 MED ORDER — HYDROMORPHONE HCL 2 MG PO TABS
2.0000 mg | ORAL_TABLET | ORAL | Status: DC | PRN
  Administered 2013-05-09 – 2013-05-10 (×2): 2 mg via ORAL
  Filled 2013-05-09 (×2): qty 1

## 2013-05-09 MED ORDER — ZOLPIDEM TARTRATE 5 MG PO TABS: 5.0000 mg | ORAL_TABLET | Freq: Every evening | ORAL | Status: DC | PRN

## 2013-05-09 MED ORDER — ROPIVACAINE HCL 5 MG/ML IJ SOLN
INTRAMUSCULAR | Status: DC | PRN
  Administered 2013-05-09: 30 mL via EPIDURAL

## 2013-05-09 MED ORDER — KETOROLAC TROMETHAMINE 30 MG/ML IJ SOLN
INTRAMUSCULAR | Status: DC | PRN
  Administered 2013-05-09: 30 mg via INTRAVENOUS

## 2013-05-09 MED ORDER — DOCUSATE SODIUM 100 MG PO CAPS
100.0000 mg | ORAL_CAPSULE | Freq: Two times a day (BID) | ORAL | Status: DC
  Administered 2013-05-09 – 2013-05-10 (×2): 100 mg via ORAL
  Filled 2013-05-09 (×2): qty 1

## 2013-05-09 MED ORDER — SODIUM CHLORIDE 0.9 % IJ SOLN
INTRAMUSCULAR | Status: DC | PRN
  Administered 2013-05-09: 60 mL via INTRAVENOUS

## 2013-05-09 MED ORDER — INDIGOTINDISULFONATE SODIUM 8 MG/ML IJ SOLN
INTRAMUSCULAR | Status: AC
  Filled 2013-05-09: qty 5

## 2013-05-09 MED ORDER — ROCURONIUM BROMIDE 100 MG/10ML IV SOLN
INTRAVENOUS | Status: DC | PRN
  Administered 2013-05-09: 10 mg via INTRAVENOUS
  Administered 2013-05-09: 50 mg via INTRAVENOUS
  Administered 2013-05-09 (×2): 20 mg via INTRAVENOUS

## 2013-05-09 MED ORDER — PNEUMOCOCCAL VAC POLYVALENT 25 MCG/0.5ML IJ INJ
0.5000 mL | INJECTION | INTRAMUSCULAR | Status: DC
  Filled 2013-05-09: qty 0.5

## 2013-05-09 MED ORDER — ONDANSETRON HCL 4 MG PO TABS: 4.0000 mg | ORAL_TABLET | Freq: Four times a day (QID) | ORAL | Status: DC | PRN

## 2013-05-09 MED ORDER — ROPIVACAINE HCL 5 MG/ML IJ SOLN
INTRAMUSCULAR | Status: AC
  Filled 2013-05-09: qty 30

## 2013-05-09 MED ORDER — HYDROMORPHONE HCL PF 1 MG/ML IJ SOLN
INTRAMUSCULAR | Status: AC
  Administered 2013-05-09: 0.5 mg via INTRAVENOUS
  Filled 2013-05-09: qty 1

## 2013-05-09 MED ORDER — NEOSTIGMINE METHYLSULFATE 1 MG/ML IJ SOLN
INTRAMUSCULAR | Status: DC | PRN
  Administered 2013-05-09: 4 mg via INTRAVENOUS

## 2013-05-09 MED ORDER — MIDAZOLAM HCL 2 MG/2ML IJ SOLN
INTRAMUSCULAR | Status: DC | PRN
  Administered 2013-05-09: 2 mg via INTRAVENOUS

## 2013-05-09 MED ORDER — ONDANSETRON HCL 4 MG/2ML IJ SOLN
INTRAMUSCULAR | Status: AC
  Filled 2013-05-09: qty 2

## 2013-05-09 MED ORDER — LIDOCAINE HCL (CARDIAC) 20 MG/ML IV SOLN
INTRAVENOUS | Status: DC | PRN
  Administered 2013-05-09: 50 mg via INTRAVENOUS

## 2013-05-09 MED ORDER — HYDROMORPHONE HCL PF 1 MG/ML IJ SOLN
0.2500 mg | INTRAMUSCULAR | Status: DC | PRN
  Administered 2013-05-09 (×2): 0.5 mg via INTRAVENOUS

## 2013-05-09 MED ORDER — ONDANSETRON HCL 4 MG/2ML IJ SOLN: 4.0000 mg | Freq: Four times a day (QID) | INTRAMUSCULAR | Status: DC | PRN

## 2013-05-09 MED ORDER — PROPOFOL 10 MG/ML IV EMUL
INTRAVENOUS | Status: AC
  Filled 2013-05-09: qty 20

## 2013-05-09 MED ORDER — LACTATED RINGERS IV SOLN
INTRAVENOUS | Status: DC
  Administered 2013-05-09 (×3): via INTRAVENOUS

## 2013-05-09 MED ORDER — HYDROMORPHONE HCL PF 1 MG/ML IJ SOLN: 0.2000 mg | INTRAMUSCULAR | Status: DC | PRN

## 2013-05-09 MED ORDER — KETOROLAC TROMETHAMINE 30 MG/ML IJ SOLN: 15.0000 mg | Freq: Once | INTRAMUSCULAR | Status: DC | PRN

## 2013-05-09 MED ORDER — MIDAZOLAM HCL 2 MG/2ML IJ SOLN
INTRAMUSCULAR | Status: AC
  Filled 2013-05-09: qty 2

## 2013-05-09 MED ORDER — STERILE WATER FOR IRRIGATION IR SOLN
Status: DC | PRN
  Administered 2013-05-09: 1000 mL

## 2013-05-09 MED ORDER — TRAMADOL HCL 50 MG PO TABS: 50.0000 mg | ORAL_TABLET | Freq: Four times a day (QID) | ORAL | Status: DC | PRN

## 2013-05-09 MED ORDER — SIMETHICONE 80 MG PO CHEW
160.0000 mg | CHEWABLE_TABLET | Freq: Four times a day (QID) | ORAL | Status: DC
  Administered 2013-05-09 – 2013-05-10 (×3): 160 mg via ORAL
  Filled 2013-05-09 (×3): qty 2

## 2013-05-09 MED ORDER — LACTATED RINGERS IV SOLN
INTRAVENOUS | Status: DC
  Administered 2013-05-09 – 2013-05-10 (×2): via INTRAVENOUS

## 2013-05-09 MED ORDER — GLYCOPYRROLATE 0.2 MG/ML IJ SOLN
INTRAMUSCULAR | Status: AC
  Filled 2013-05-09: qty 3

## 2013-05-09 MED ORDER — LIDOCAINE HCL (CARDIAC) 20 MG/ML IV SOLN
INTRAVENOUS | Status: AC
  Filled 2013-05-09: qty 5

## 2013-05-09 MED ORDER — NEOSTIGMINE METHYLSULFATE 1 MG/ML IJ SOLN
INTRAMUSCULAR | Status: AC
  Filled 2013-05-09: qty 1

## 2013-05-09 MED ORDER — PROMETHAZINE HCL 25 MG/ML IJ SOLN: 6.2500 mg | INTRAMUSCULAR | Status: DC | PRN

## 2013-05-09 MED ORDER — MEPERIDINE HCL 25 MG/ML IJ SOLN: 6.2500 mg | INTRAMUSCULAR | Status: DC | PRN

## 2013-05-09 MED ORDER — GLYCOPYRROLATE 0.2 MG/ML IJ SOLN
INTRAMUSCULAR | Status: DC | PRN
  Administered 2013-05-09: 0.6 mg via INTRAVENOUS

## 2013-05-09 SURGICAL SUPPLY — 73 items
APL SKNCLS STERI-STRIP NONHPOA (GAUZE/BANDAGES/DRESSINGS) ×3
BAG URINE DRAINAGE (UROLOGICAL SUPPLIES) ×4 IMPLANT
BARRIER ADHS 3X4 INTERCEED (GAUZE/BANDAGES/DRESSINGS) ×4 IMPLANT
BENZOIN TINCTURE PRP APPL 2/3 (GAUZE/BANDAGES/DRESSINGS) ×4 IMPLANT
BRR ADH 4X3 ABS CNTRL BYND (GAUZE/BANDAGES/DRESSINGS) ×3
CHLORAPREP W/TINT 26ML (MISCELLANEOUS) ×4 IMPLANT
CONT PATH 16OZ SNAP LID 3702 (MISCELLANEOUS) ×4 IMPLANT
COVER MAYO STAND STRL (DRAPES) ×4 IMPLANT
COVER TABLE BACK 60X90 (DRAPES) ×8 IMPLANT
COVER TIP SHEARS 8 DVNC (MISCELLANEOUS) ×3 IMPLANT
COVER TIP SHEARS 8MM DA VINCI (MISCELLANEOUS) ×1
DECANTER SPIKE VIAL GLASS SM (MISCELLANEOUS) ×4 IMPLANT
DRAPE HUG U DISPOSABLE (DRAPE) ×4 IMPLANT
DRAPE LG THREE QUARTER DISP (DRAPES) ×8 IMPLANT
DRAPE WARM FLUID 44X44 (DRAPE) ×4 IMPLANT
ELECT REM PT RETURN 9FT ADLT (ELECTROSURGICAL) ×4
ELECTRODE REM PT RTRN 9FT ADLT (ELECTROSURGICAL) ×3 IMPLANT
EVACUATOR SMOKE 8.L (FILTER) ×4 IMPLANT
GAUZE VASELINE 3X9 (GAUZE/BANDAGES/DRESSINGS) IMPLANT
GLOVE BIO SURGEON STRL SZ 6.5 (GLOVE) ×8 IMPLANT
GLOVE BIOGEL M 6.5 STRL (GLOVE) ×16 IMPLANT
GLOVE BIOGEL PI IND STRL 6.5 (GLOVE) ×6 IMPLANT
GLOVE BIOGEL PI INDICATOR 6.5 (GLOVE) ×2
GOWN STRL REIN XL XLG (GOWN DISPOSABLE) ×24 IMPLANT
GYRUS RUMI II 2.5CM BLUE (DISPOSABLE)
GYRUS RUMI II 3.5CM BLUE (DISPOSABLE)
GYRUS RUMI II 4.0CM BLUE (DISPOSABLE)
KIT ACCESSORY DA VINCI DISP (KITS) ×1
KIT ACCESSORY DVNC DISP (KITS) ×3 IMPLANT
LEGGING LITHOTOMY PAIR STRL (DRAPES) ×4 IMPLANT
NDL INSUFFLATION 14GA 120MM (NEEDLE) IMPLANT
NEEDLE INSUFFLATION 120MM (ENDOMECHANICALS) ×4 IMPLANT
NEEDLE INSUFFLATION 14GA 120MM (NEEDLE) ×4 IMPLANT
OCCLUDER COLPOPNEUMO (BALLOONS) ×1 IMPLANT
PACK LAVH (CUSTOM PROCEDURE TRAY) ×4 IMPLANT
PAD PREP 24X48 CUFFED NSTRL (MISCELLANEOUS) ×8 IMPLANT
PLUG CATH AND CAP STER (CATHETERS) ×4 IMPLANT
PROTECTOR NERVE ULNAR (MISCELLANEOUS) ×8 IMPLANT
RUMI II 3.0CM BLUE KOH-EFFICIE (DISPOSABLE) IMPLANT
RUMI II GYRUS 2.5CM BLUE (DISPOSABLE) IMPLANT
RUMI II GYRUS 3.5CM BLUE (DISPOSABLE) IMPLANT
RUMI II GYRUS 4.0CM BLUE (DISPOSABLE) IMPLANT
SET CYSTO W/LG BORE CLAMP LF (SET/KITS/TRAYS/PACK) ×4 IMPLANT
SET IRRIG TUBING LAPAROSCOPIC (IRRIGATION / IRRIGATOR) ×4 IMPLANT
SOLUTION ELECTROLUBE (MISCELLANEOUS) ×4 IMPLANT
STRIP CLOSURE SKIN 1/4X4 (GAUZE/BANDAGES/DRESSINGS) ×4 IMPLANT
SUT VIC AB 0 CT1 27 (SUTURE) ×8
SUT VIC AB 0 CT1 27XBRD ANBCTR (SUTURE) ×3 IMPLANT
SUT VIC AB 0 CT1 27XBRD ANTBC (SUTURE) IMPLANT
SUT VICRYL 0 UR6 27IN ABS (SUTURE) ×4 IMPLANT
SUT VICRYL RAPIDE 4/0 PS 2 (SUTURE) ×8 IMPLANT
SUT VLOC 180 0 9IN  GS21 (SUTURE)
SUT VLOC 180 0 9IN GS21 (SUTURE) IMPLANT
SYR 30ML LL (SYRINGE) ×4 IMPLANT
SYR 50ML LL SCALE MARK (SYRINGE) ×4 IMPLANT
SYRINGE 10CC LL (SYRINGE) ×4 IMPLANT
SYSTEM CONVERTIBLE TROCAR (TROCAR) ×4 IMPLANT
TIP RUMI ORANGE 6.7MMX12CM (TIP) IMPLANT
TIP UTERINE 5.1X6CM LAV DISP (MISCELLANEOUS) IMPLANT
TIP UTERINE 6.7X10CM GRN DISP (MISCELLANEOUS) IMPLANT
TIP UTERINE 6.7X6CM WHT DISP (MISCELLANEOUS) IMPLANT
TIP UTERINE 6.7X8CM BLUE DISP (MISCELLANEOUS) ×1 IMPLANT
TOWEL OR 17X24 6PK STRL BLUE (TOWEL DISPOSABLE) ×12 IMPLANT
TRAY FOLEY BAG SILVER LF 14FR (CATHETERS) ×4 IMPLANT
TROCAR 12M 150ML BLUNT (TROCAR) ×3 IMPLANT
TROCAR DILATING TIP 12MM 150MM (ENDOMECHANICALS) ×1 IMPLANT
TROCAR DISP BLADELESS 8 DVNC (TROCAR) ×3 IMPLANT
TROCAR DISP BLADELESS 8MM (TROCAR) ×1
TROCAR XCEL 12X100 BLDLESS (ENDOMECHANICALS) IMPLANT
TROCAR XCEL NON-BLD 5MMX100MML (ENDOMECHANICALS) ×4 IMPLANT
TUBING FILTER THERMOFLATOR (ELECTROSURGICAL) ×4 IMPLANT
WARMER LAPAROSCOPE (MISCELLANEOUS) ×4 IMPLANT
WATER STERILE IRR 1000ML POUR (IV SOLUTION) ×12 IMPLANT

## 2013-05-09 NOTE — Progress Notes (Signed)
Day of Surgery Procedure(s) (LRB): ROBOTIC ASSISTED TOTAL HYSTERECTOMY (N/A) CYSTOSCOPY (N/A) BILATERAL SALPINGECTOMY (Bilateral)  Subjective: Patient reports tolerating PO.    Objective: I have reviewed patient's vital signs, intake and output and medications.  General: alert, cooperative and appears stated age Resp: clear to auscultation bilaterally Cardio: regular rate and rhythm, S1, S2 normal, no murmur, click, rub or gallop GI: soft, non-tender; bowel sounds normal; no masses,  no organomegaly Extremities: extremities normal, atraumatic, no cyanosis or edema Vaginal Bleeding: none  Assessment: s/p Procedure(s): ROBOTIC ASSISTED TOTAL HYSTERECTOMY (N/A) CYSTOSCOPY (N/A) BILATERAL SALPINGECTOMY (Bilateral): stable and progressing well  Plan: Encourage ambulation continue current care  LOS: 0 days    Ebony Ward H  05/09/2013, 7:06 PM

## 2013-05-09 NOTE — H&P (View-Only) (Signed)
47 y.o.SingleAfrican American [id number] female here for consideration for robotically assisted TLH  with Bilateral salpingectomy and without removal of ovaries.    She complains of menorrhagia. Pt treated with depo-lupron for menorrhagia and has responded well.  Last cycle very light but flowed for 1w.  Pt reorts energy is better. PUS on 02/14/2103 showed the uterus to be enlarged, 9.8x7.x5.5cm and contain 0 fibroids,  Adnexa Normal.  Endometrial biopsy on 01/06/2103 was PORTIONS OF NONPHASIC ENDOMETRIUM WITH BREAKDOWN  -NO HYPERPLASIA OR CARCINOMA IDENTIFIED        The current method of family planning is condoms    Hormones:lurpon   reports that she has been smoking Cigarettes.  She has been smoking about 0.00 packs per day. She has never used smokeless tobacco. She reports that she uses illicit drugs (Marijuana). She reports that she does not drink alcohol.  @CHLEC1NM @  Health Maintenance  Topic Date Due  . Tetanus/tdap  10/07/1986  . Influenza Vaccine  02/23/2013  . Pap Smear  01/06/2016    Family History  Problem Relation Age of Onset  . Hypertension Mother   . Heart Problems Father     Patient Active Problem List   Diagnosis Date Noted  . Menorrhagia 01/05/2013  . Anemia 01/05/2013  . Smoker     Past Medical History  Diagnosis Date  . Smoker   . Anemia   . Blood transfusion without reported diagnosis     History reviewed. No pertinent past surgical history.  Allergies: @RRALLERGIES @  Current Outpatient Prescriptions  Medication Sig Dispense Refill  . IRON PO Take by mouth.      . LUPRON DEPOT 11.25 MG injection Inject 11.25 mg into the muscle every 3 (three) months.       . Multiple Vitamins-Minerals (MULTIVITAMIN PO) Take by mouth.      Marland Kitchen omeprazole (PRILOSEC) 20 MG capsule Take 20 mg by mouth as needed.       No current facility-administered medications for this visit.      Exam:    BP 110/68  Pulse 68  Ht 5' 10.5" (1.791 m)  Wt 169 lb (76.658 kg)   BMI 23.9 kg/m2  LMP 04/09/2013  General appearance: alert, cooperative and appears stated age Head: Normocephalic, without obvious abnormality, atraumatic Lungs: clear to auscultation bilaterally Heart: regular rate and rhythm, S1, S2 normal, no murmur, click, rub or gallop Abdomen: soft, non-tender; bowel sounds normal; no masses,  no organomegaly Extremities: extremities normal, atraumatic, no cyanosis or edema Lymph nodes: Cervical, supraclavicular, and axillary nodes normal. no inguinal nodes palpated Neurologic: Grossly normal   Pelvic: External genitalia:  no lesions              Bartholins and Skenes: normal                 Vagina: normal appearing vagina with normal color and discharge, no lesions              Cervix: normal appearance                     Bimanual Exam:  Uterus:  uterus is normal size, shape, consistency and nontender                                      Adnexa:    normal adnexa in size, nontender and no masses  Rectovaginal: Deferred                                      Anus:  defer exam   A/P:  The planned procedure was discussed with the patient.  Pre-op instructions, hospital course, and post-op instructions reviewed.  Post op instruction booklet given and reviewed.  Risks and possible complications discussed, including but not limited to, bleeding and possible transfusion; infection; anesthesia complications; injury to visceral organ requiring further surgery, either immediately or delayed; wound complications including infection, blood collections, and bowel herniation; allergic reactions; swelling of the face due to prolonged positioning;  VTE or air emboli; nerve injury related to prolonged positioning; even death.    The patient was given the opportunity to watch the informed consent video on hysterectomy.  Her questions were invited and answered.  The patient states she understands the risks and possible complications,  and wishes to proceed as planned.  Consent form signed.  She was instructed to take two 200 mg celebrex capsules with a small sip of water the morning of the procedure, and then to take one bid on POD 1 and 2.  Rx given dilaudid  2mg  #20 for post-op Ready for surgery.   CBC today Encouraged to stop smoking Condom use stressed

## 2013-05-09 NOTE — Anesthesia Preprocedure Evaluation (Signed)
Anesthesia Evaluation  Patient identified by MRN, date of birth, ID band Patient awake    Reviewed: Allergy & Precautions, H&P , NPO status , Patient's Chart, lab work & pertinent test results  Airway Mallampati: I TM Distance: >3 FB Neck ROM: full    Dental no notable dental hx. (+) Teeth Intact   Pulmonary Current Smoker,    Pulmonary exam normal       Cardiovascular negative cardio ROS      Neuro/Psych negative neurological ROS  negative psych ROS   GI/Hepatic Neg liver ROS, GERD-  Medicated and Controlled,  Endo/Other  negative endocrine ROS  Renal/GU negative Renal ROS     Musculoskeletal negative musculoskeletal ROS (+)   Abdominal Normal abdominal exam  (+)   Peds  Hematology negative hematology ROS (+)   Anesthesia Other Findings   Reproductive/Obstetrics negative OB ROS                           Anesthesia Physical Anesthesia Plan  ASA: II  Anesthesia Plan: General   Post-op Pain Management:    Induction: Intravenous  Airway Management Planned: Oral ETT  Additional Equipment:   Intra-op Plan:   Post-operative Plan:   Informed Consent: I have reviewed the patients History and Physical, chart, labs and discussed the procedure including the risks, benefits and alternatives for the proposed anesthesia with the patient or authorized representative who has indicated his/her understanding and acceptance.   Dental Advisory Given  Plan Discussed with: CRNA and Surgeon  Anesthesia Plan Comments:         Anesthesia Quick Evaluation

## 2013-05-09 NOTE — Anesthesia Procedure Notes (Signed)
Procedure Name: Intubation Date/Time: 05/09/2013 8:37 AM Performed by: Shanon Payor Pre-anesthesia Checklist: Patient identified, Emergency Drugs available, Suction available, Patient being monitored and Timeout performed Oxygen Delivery Method: Circle system utilized Preoxygenation: Pre-oxygenation with 100% oxygen Intubation Type: Combination inhalational/ intravenous induction Ventilation: Mask ventilation without difficulty Laryngoscope Size: Mac and 3 Grade View: Grade I Tube type: Oral Tube size: 7.0 mm Number of attempts: 1 Airway Equipment and Method: Stylet Placement Confirmation: ETT inserted through vocal cords under direct vision,  positive ETCO2 and breath sounds checked- equal and bilateral Secured at: 21 cm Tube secured with: Tape

## 2013-05-09 NOTE — Anesthesia Postprocedure Evaluation (Signed)
  Anesthesia Post-op Note  Patient: Ebony Ward  Procedure(s) Performed: Procedure(s): ROBOTIC ASSISTED TOTAL HYSTERECTOMY (N/A) CYSTOSCOPY (N/A) BILATERAL SALPINGECTOMY (Bilateral) Patient is awake and responsive. Pain and nausea are reasonably well controlled. Vital signs are stable and clinically acceptable. Oxygen saturation is clinically acceptable. There are no apparent anesthetic complications at this time. Patient is ready for discharge.

## 2013-05-09 NOTE — Transfer of Care (Signed)
Immediate Anesthesia Transfer of Care Note  Patient: Ebony Ward  Procedure(s) Performed: Procedure(s): ROBOTIC ASSISTED TOTAL HYSTERECTOMY (N/A) CYSTOSCOPY (N/A) BILATERAL SALPINGECTOMY (Bilateral)  Patient Location: PACU  Anesthesia Type:General  Level of Consciousness: awake, alert  and oriented  Airway & Oxygen Therapy: Patient Spontanous Breathing and Patient connected to nasal cannula oxygen  Post-op Assessment: Report given to PACU RN and Post -op Vital signs reviewed and stable  Post vital signs: Reviewed and stable  Complications: No apparent anesthesia complications

## 2013-05-09 NOTE — Interval H&P Note (Signed)
History and Physical Interval Note:  05/09/2013 8:28 AM  Ebony Ward  has presented today for surgery, with the diagnosis of menorrhagia,anemia CPT S2099 [phone]  The various methods of treatment have been discussed with the patient and family. After consideration of risks, benefits and other options for treatment, the patient has consented to  Procedure(s): ROBOTIC ASSISTED TOTAL HYSTERECTOMY (N/A) CYSTOSCOPY (N/A) BILATERAL SALPINGECTOMY (Bilateral) as a surgical intervention .  The patient's history has been reviewed, patient examined, no change in status, stable for surgery.  I have reviewed the patient's chart and labs.  Questions were answered to the patient's satisfaction.     Eann Cleland H

## 2013-05-10 ENCOUNTER — Encounter (HOSPITAL_COMMUNITY): Payer: Self-pay | Admitting: Gynecology

## 2013-05-10 LAB — CBC
MCHC: 32.6 g/dL (ref 30.0–36.0)
Platelets: 142 10*3/uL — ABNORMAL LOW (ref 150–400)
RBC: 4.16 MIL/uL (ref 3.87–5.11)
WBC: 8.2 10*3/uL (ref 4.0–10.5)

## 2013-05-10 NOTE — Op Note (Signed)
NAMESAHARA, Ebony Ward                   ACCOUNT NO.:  [id number]  MEDICAL RECORD NO.:  [id number]  LOCATION:  9302                          FACILITY:  [facility]  PHYSICIAN:  Ivor Costa. Farrel Gobble, M.D. DATE OF BIRTH:  02/28/1966  DATE OF PROCEDURE:  05/09/2013 DATE OF DISCHARGE:                              OPERATIVE REPORT   PREOPERATIVE DIAGNOSIS:  Menorrhagia with consumptive anemia.  POSTOPERATIVE DIAGNOSIS:  Menorrhagia with consumptive anemia.  PROCEDURE:  Robotic-assisted total hysterectomy.  Additional procedures were bilateral salpingectomy and cystoscopy.  SURGEON:  Ivor Costa. Farrel Gobble, MD  ASSISTANT:  Dollene Primrose. Edward Jolly, [place]  ANESTHESIA:  General.  IV FLUIDS:  2200 mL of lactated Ringer's.  URINE OUTPUT:  205.  ESTIMATED BLOOD LOSS:  100 mL.  INDICATIONS:  The patient is a 47 year old, G3, P3, with history of heavy menorrhagia who presented to the office complaining of fatigue. The patient was noted to have a hemoglobin of 5.  The patient had a negative EMB for pathology and was treated with Depo-Lupron in order to help bring up her blood count.  The patient responded well.  Her hemoglobin was now 12 and she presents for definitive surgery.  FINDINGS:  Normal-appearing uterus, tubes, and ovaries as well as cervix.  PATHOLOGY:  Uterus, tubes, ovaries, and cervix.  COMPLICATIONS:  None.  DESCRIPTION OF PROCEDURE:  The patient was taken to the operating room in the dorsal lithotomy position consistent with robotic surgery.  The bean bag was blown up.  Careful attention to flexion of the knees and positioning of the hands on that.  The patient was then prepped and draped in usual sterile fashion.  A bimanual exam was performed.  The orientation of the uterus was confirmed.  Sterile weighted speculum was placed in the vagina.  The cervix was visualized, stabilized with a single-tooth tenaculum.  The uterus sounded to 8.  Cervix was then dilated up to 21-French after which two stay  sutures were placed anterior and posterior with 0 Vicryl.  The cervix was measured at 3.5, and a large KoH ring was placed on the RUMI.  The RUMI was then advanced through the cervix into the fundus and blown up appropriately, complete mobilization of the uterus was confirmed.  Foley was placed.  Gloves were then changed and attention was turned to the abdomen where a supraumbilical incision was made with the scalpel.  The Veress needle was inserted, saline opening pressure was 2 and pneumoperitoneum was then created until tympany was appreciated about the liver after which, a #12 trocar was then advanced through the supraumbilical port placed in the abdomen and was confirmed.  Two 8 mm ports were then needed on left and right side.  10 cm from the midline port under direct visualization. An assistance port was then made in the right lower quadrant for a 5-mm trocar.  The patient was then positioned appropriately, robot was docked.  The tubes were noted to be mobile.  The ureters were identified bilaterally, markedly inferior to the infundibulopelvic ligament.  The tube was then deviated towards the midline and sharply dissected off after being treated with PK cautery.  The dissection carried through until  the uterus was reached, this was done bilaterally.  The tubo- ovarian ligament was then grasped, treated with PK cautery, and similarly transected sharply.  The posterior leaf of the broad ligament was incised.  The round ligament was identified, tented up, and treated with PK cautery, and then sharply incised.  The anterior leaf of the broad ligament was incised and a bladder flap was able to be created. There was some scarring noted in the midline from a prior C-section. Careful attention was taken to remove the bladder from the anterior uterus.  This procedure was similarly done bilaterally.  The uterine vessels were then skeletonized, and treated with PK cautery, they were however  not transected until both sides had been completed.  The dissection was carried through until the vessels were able to be completely dissected off the upper vagina.  The anterior colpotomy was then performed once we felt that the bladder was safely inferior to where the [facility] ring was palpable.  The anterior colpotomy was made with the Endo Shears, and dissected around from uterosacral to uterosacral. The uterus was then deflected and the posterior colpotomy was performed. The uterus was delivered with ease through the top of the vagina.  The vagina was then elevated and stabilized.  The vagina was plicated with Quill suture from the right coronary to the left making short imbricate and include the vaginal mucosa.  Once the left angle was achieved, the suture was then brought back stitches towards the midline.  The suture was cut and delivered through the 8 mm port.  At the beginning of the case, 60 mL ropivacaine had been injected into the pelvis, it was somewhat obscuring our operative view of the anterior cul-de-sac. Ropivacaine had been removed for better visualization.  At the end of the case, an additional 30 mL of ropivacaine was replaced back into the pelvis.  The operative site was inspected.  Hemostasis was assured.  The patient had been given indigo carmine, a cystoscopy was performed. There was no injury noted to the urethra.  The trigone was unremarkable. Copious blue urine was noted from each orifice.  There was no suture or thermal damage to the bladder neck up to the bladder dome and the bubble was noted.  The Foley was then replaced and attention was then turned back to the upper abdomen.  The ports were then removed under direct visualization.  The supraumbilical fascia was identified, tented up, and then plicated with 0 Vicryl.  The 3 larger ports was closed with Monocryl followed by Dermabond on all 4 ports.  The patient tolerated the procedure well.  Sponge, lap, needle  counts were correct x2.  She has been given IV Tylenol as well as Celebrex preoperatively, she also given cefotetan 2 g preoperatively and ropivacaine preoperatively.     Ivor Costa. Farrel Gobble, M.D.     THL/MEDQ  D:  05/10/2013  T:  05/10/2013  Job:  [id number]

## 2013-05-10 NOTE — Progress Notes (Signed)
1 Day Post-Op Procedure(s) (LRB): ROBOTIC ASSISTED TOTAL HYSTERECTOMY (N/A) CYSTOSCOPY (N/A) BILATERAL SALPINGECTOMY (Bilateral)  Subjective: Patient reports tolerating PO, + BM and no problems voiding.    Objective: I have reviewed patient's vital signs, intake and output, medications and labs.  General: alert, cooperative and appears stated age Resp: clear to auscultation bilaterally Cardio: regular rate and rhythm, S1, S2 normal, no murmur, click, rub or gallop GI: soft, non-tender; bowel sounds normal; no masses,  no organomegaly Extremities: extremities normal, atraumatic, no cyanosis or edema Vaginal Bleeding: none  Assessment: s/p Procedure(s): ROBOTIC ASSISTED TOTAL HYSTERECTOMY (N/A) CYSTOSCOPY (N/A) BILATERAL SALPINGECTOMY (Bilateral): stable  Plan: Discharge home  LOS: 1 day    Ebony Ward H 05/10/2013, 8:32 AM

## 2013-05-10 NOTE — Anesthesia Postprocedure Evaluation (Signed)
  Anesthesia Post-op Note  Patient: Ebony Ward  Procedure(s) Performed: Procedure(s): ROBOTIC ASSISTED TOTAL HYSTERECTOMY (N/A) CYSTOSCOPY (N/A) BILATERAL SALPINGECTOMY (Bilateral)  Patient Location: [facility]  Anesthesia Type:General  Level of Consciousness: awake, alert  and oriented  Airway and Oxygen Therapy: Patient Spontanous Breathing  Post-op Pain: none  Post-op Assessment: Post-op Vital signs reviewed and Patient's Cardiovascular Status Stable  Post-op Vital Signs: Reviewed and stable  Complications: No apparent anesthesia complications

## 2013-05-10 NOTE — Progress Notes (Signed)
Patient discharged to home with her partner. Condition stable. Patient ambulated to car with Kenney Houseman, RN. No equipment ordered for home at discharge. Patient reports abdominal pain improved.

## 2013-05-10 NOTE — Brief Op Note (Signed)
05/09/2013  8:51 AM  PATIENT:  Ebony Ward  47 y.o. female  PRE-OPERATIVE DIAGNOSIS:  menorrhagia,anemia CPT S2099 [id number]  POST-OPERATIVE DIAGNOSIS:  menorrhagia,anemia CPT S2099 [id number]  PROCEDURE:  Procedure(s): ROBOTIC ASSISTED TOTAL HYSTERECTOMY (N/A) CYSTOSCOPY (N/A) BILATERAL SALPINGECTOMY (Bilateral)  SURGEON:  Surgeon(s) and Role:    * Bennye Alm, MD - Primary    * Melony Overly, MD - Assisting  PHYSICIAN ASSISTANT:   ASSISTANTS: none   ANESTHESIA:   general  EBL:  Total I/O In: -  Out: 450 [Urine:450]  BLOOD ADMINISTERED:none  DRAINS: none   LOCAL MEDICATIONS USED:  Amount: 90 ml and OTHER ropivicaine-dilute  SPECIMEN:  Source of Specimen:  uterus, tubes  DISPOSITION OF SPECIMEN:  PATHOLOGY  COUNTS:  YES  TOURNIQUET:  * No tourniquets in log *  DICTATION: .Other Dictation: Dictation Number [id number]  PLAN OF CARE: Admit for overnight observation  PATIENT DISPOSITION:  Short Stay   Delay start of Pharmacological VTE agent (>24hrs) due to surgical blood loss or risk of bleeding: not applicable

## 2013-05-16 ENCOUNTER — Ambulatory Visit (INDEPENDENT_AMBULATORY_CARE_PROVIDER_SITE_OTHER): Payer: BC Managed Care – PPO | Admitting: Gynecology

## 2013-05-16 VITALS — BP 102/72 | HR 64 | Resp 14 | Ht 70.5 in | Wt 170.0 lb

## 2013-05-16 DIAGNOSIS — D649 Anemia, unspecified: Secondary | ICD-10-CM

## 2013-05-16 DIAGNOSIS — N920 Excessive and frequent menstruation with regular cycle: Secondary | ICD-10-CM

## 2013-05-16 NOTE — Progress Notes (Signed)
Subjective:     Ebony Ward is a 47 y.o. female who presents to the clinic 1 weeks status post robotic hysterectomy with bilateral sapingectomy for abnormal uterine bleeding and anemia. Eating a regular diet without difficulty. Bowel movements are normal. The patient is not having any pain. No bleeding, took only 1 dilaudid today.  The following portions of the patient's history were reviewed and updated as appropriate: allergies, current medications, past family history, past medical history, past social history, past surgical history and problem list.  Review of Systems Pertinent items are noted in HPI.    Objective:    LMP 04/09/2013 General:  alert, cooperative and appears stated age  Abdomen: soft, bowel sounds active, non-tender  Incision:   healing well, no drainage, no erythema, no hernia, no seroma, no swelling, no dehiscence, incision well approximated     Assessment:    Doing well postoperatively. Operative findings again reviewed. Pathology report discussed.    Plan:    1. Continue any current medications. 2. Wound care discussed. 3. Activity restrictions: no repetitive motion and no sex, can drive once on no pain meds and can hit the brake 4. Anticipated return to work: 4 weeks.

## 2013-06-15 ENCOUNTER — Encounter: Payer: Self-pay | Admitting: Gynecology

## 2013-06-15 ENCOUNTER — Ambulatory Visit (INDEPENDENT_AMBULATORY_CARE_PROVIDER_SITE_OTHER): Payer: BC Managed Care – PPO | Admitting: Gynecology

## 2013-06-15 ENCOUNTER — Ambulatory Visit: Payer: BC Managed Care – PPO

## 2013-06-15 VITALS — BP 120/78 | HR 72 | Resp 18 | Ht 70.5 in | Wt 176.0 lb

## 2013-06-15 DIAGNOSIS — D649 Anemia, unspecified: Secondary | ICD-10-CM

## 2013-06-15 DIAGNOSIS — Z9889 Other specified postprocedural states: Secondary | ICD-10-CM

## 2013-06-15 LAB — CBC
HCT: 35.3 % — ABNORMAL LOW (ref 36.0–46.0)
MCHC: 34.6 g/dL (ref 30.0–36.0)
MCV: 79.7 fL (ref 78.0–100.0)
RDW: 15 % (ref 11.5–15.5)

## 2013-06-15 NOTE — Patient Instructions (Signed)
May return to work without restictions

## 2013-06-15 NOTE — Progress Notes (Signed)
Subjective:     Patient ID: Ebony Ward, female   DOB: 10/02/1965, 47 y.o.   MRN: [id number]  HPI Comments: Pt here 6w post-op s/p robotic hysterecomy for DUB and severe anemia.  Pt is without complaints, she denies constipation , vaginal bleeding, fatigue,  Pt is eager to return back to work.  Pt has not been sexually active. Pt denies any hot flashes.    Review of Systems  Constitutional: Negative for fatigue.  Gastrointestinal: Negative for diarrhea and constipation.  Genitourinary: Negative for vaginal bleeding, vaginal discharge, difficulty urinating, vaginal pain and pelvic pain.       Objective:   Physical Exam  Constitutional: She is oriented to person, place, and time. She appears well-developed and well-nourished.  Neurological: She is alert and oriented to person, place, and time.  Pelvic exam: VULVA: normal appearing vulva with no masses, tenderness or lesions, VAGINA: normal appearing vagina with normal color and discharge, no lesions, CERVIX: surgically absent, UTERUS: surgically absent, vaginal cuff well healed, ADNEXA: normal adnexa in size, nontender and no masses.       Assessment:     5w post-op doing well     Plan:     May return to work without restictions Check CBC Continue pelvic rest for 8w post-op

## 2013-06-18 ENCOUNTER — Telehealth: Payer: Self-pay | Admitting: *Deleted

## 2013-06-18 NOTE — Telephone Encounter (Signed)
Message copied by Lorraine Lax on Mon Jun 18, 2013 10:57 AM ------      Message from: Douglass Rivers      Created: Sun Jun 17, 2013  8:41 PM       Inform back to presurgical levels, can probably take just a multivitamin at this point ------

## 2013-06-18 NOTE — Telephone Encounter (Signed)
Left Message To Call Back  

## 2013-06-19 NOTE — Telephone Encounter (Signed)
Patient notified see labs 

## 2013-11-01 ENCOUNTER — Other Ambulatory Visit: Payer: Self-pay | Admitting: Gynecology

## 2013-11-02 NOTE — Telephone Encounter (Signed)
Last AEX 01/05/2013 Last refill 02/02/2013 #4/ 0 refills Next appt 01/11/2014  Please approve or deny Rx.

## 2013-11-02 NOTE — Telephone Encounter (Signed)
-  Called patient and she states she hasn't requested any med. - Rx request denied.

## 2013-11-06 ENCOUNTER — Telehealth: Payer: Self-pay | Admitting: Emergency Medicine

## 2013-11-06 NOTE — Telephone Encounter (Signed)
Message copied by Ebert Forrester, Trellis Paganini on Tue Nov 06, 2013 11:14 AM ------      Message from: Elveria Rising      Created: Mon Nov 05, 2013  6:14 PM       i had gotten a rx request for flagyl that she used to treat trich, so if she thinks she has another infection, she needs to be seen      TL      ----- Message -----         From: Karen Chafe, RN         Sent: 11/02/2013   4:52 PM           To: Azalia Bilis, MD            For surgical follow up? I just want to know what you would like to see her for, for when I call.       ----- Message -----         From: Azalia Bilis, MD         Sent: 11/02/2013   2:52 PM           To: Karen Chafe, RN            Pls inform pt needs appt             ------

## 2013-11-06 NOTE — Telephone Encounter (Signed)
Message left to return call to Bobbie Valletta at [phone].    

## 2013-11-08 NOTE — Telephone Encounter (Signed)
Left message to call Kaitlyn at [phone]. 

## 2013-11-13 NOTE — Telephone Encounter (Signed)
Dialed [phone] in attempt to reach patient. Advised by person who answered call "You have the wrong number." and was hung up on. Number correct per release of information form. Called home number [phone] and received message "Your phone number can not be completed as dial." Tried x2.  Dr.Lathrop, have tried to reach patient multiple times at multiple numbers she provided with no return call and not able to get through. Okay to close encounter?

## 2013-11-13 NOTE — Telephone Encounter (Signed)
yes

## 2013-12-06 ENCOUNTER — Encounter: Payer: Self-pay | Admitting: Gynecology

## 2014-01-11 ENCOUNTER — Ambulatory Visit (INDEPENDENT_AMBULATORY_CARE_PROVIDER_SITE_OTHER): Payer: BC Managed Care – PPO | Admitting: Gynecology

## 2014-01-11 ENCOUNTER — Encounter: Payer: Self-pay | Admitting: Gynecology

## 2014-01-11 VITALS — BP 122/74 | HR 76 | Ht 70.5 in | Wt 193.0 lb

## 2014-01-11 DIAGNOSIS — Z0000 Encounter for general adult medical examination without abnormal findings: Secondary | ICD-10-CM

## 2014-01-11 DIAGNOSIS — Z01419 Encounter for gynecological examination (general) (routine) without abnormal findings: Secondary | ICD-10-CM

## 2014-01-11 LAB — POCT URINALYSIS DIPSTICK
BILIRUBIN UA: NEGATIVE
Blood, UA: NEGATIVE
Glucose, UA: NEGATIVE
Ketones, UA: NEGATIVE
LEUKOCYTES UA: NEGATIVE
NITRITE UA: NEGATIVE
Protein, UA: NEGATIVE
Urobilinogen, UA: NEGATIVE
pH, UA: 6

## 2014-01-11 LAB — HEMOGLOBIN, FINGERSTICK: HEMOGLOBIN, FINGERSTICK: 13.5 g/dL (ref 12.0–16.0)
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
## 2014-05-27 ENCOUNTER — Encounter: Payer: Self-pay | Admitting: Gynecology

## 2014-06-26 ENCOUNTER — Telehealth: Payer: Self-pay | Admitting: Gynecology

## 2014-06-26 NOTE — Telephone Encounter (Signed)
LMTCB about canceled appointment °

## 2014-08-20 ENCOUNTER — Encounter: Payer: Self-pay | Admitting: Obstetrics & Gynecology

## 2014-11-29 ENCOUNTER — Encounter (HOSPITAL_COMMUNITY): Payer: Self-pay | Admitting: Emergency Medicine

## 2014-11-29 ENCOUNTER — Emergency Department (HOSPITAL_COMMUNITY): Payer: BLUE CROSS/BLUE SHIELD

## 2014-11-29 ENCOUNTER — Emergency Department (HOSPITAL_COMMUNITY)
Admission: EM | Admit: 2014-11-29 | Discharge: 2014-11-30 | Disposition: A | Discharge status: Home / Self Care | Payer: BLUE CROSS/BLUE SHIELD | Attending: Emergency Medicine | Admitting: Emergency Medicine

## 2014-11-29 DIAGNOSIS — Y9289 Other specified places as the place of occurrence of the external cause: Secondary | ICD-10-CM | POA: Insufficient documentation

## 2014-11-29 DIAGNOSIS — Y998 Other external cause status: Secondary | ICD-10-CM | POA: Insufficient documentation

## 2014-11-29 DIAGNOSIS — S0081XA Abrasion of other part of head, initial encounter: Secondary | ICD-10-CM

## 2014-11-29 DIAGNOSIS — Z79899 Other long term (current) drug therapy: Secondary | ICD-10-CM | POA: Diagnosis not present

## 2014-11-29 DIAGNOSIS — S60512A Abrasion of left hand, initial encounter: Secondary | ICD-10-CM | POA: Insufficient documentation

## 2014-11-29 DIAGNOSIS — S0993XA Unspecified injury of face, initial encounter: Secondary | ICD-10-CM | POA: Diagnosis present

## 2014-11-29 DIAGNOSIS — Z720 Tobacco use: Secondary | ICD-10-CM | POA: Diagnosis not present

## 2014-11-29 DIAGNOSIS — S0031XA Abrasion of nose, initial encounter: Secondary | ICD-10-CM | POA: Insufficient documentation

## 2014-11-29 DIAGNOSIS — Y9389 Activity, other specified: Secondary | ICD-10-CM | POA: Insufficient documentation

## 2014-11-29 DIAGNOSIS — S80211A Abrasion, right knee, initial encounter: Secondary | ICD-10-CM | POA: Diagnosis not present

## 2014-11-29 MED ORDER — OXYCODONE-ACETAMINOPHEN 5-325 MG PO TABS
1.0000 | ORAL_TABLET | Freq: Once | ORAL | Status: AC
  Administered 2014-11-29: 1 via ORAL
  Filled 2014-11-29: qty 1
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
## 2014-11-29 NOTE — ED Notes (Signed)
Pt had taken ice pack off of knee. Stated that she did not want another ice pack.

## 2014-11-30 ENCOUNTER — Emergency Department (HOSPITAL_COMMUNITY): Payer: BLUE CROSS/BLUE SHIELD

## 2014-11-30 MED ORDER — BACITRACIN ZINC 500 UNIT/GM EX OINT: 1.0000 "application " | TOPICAL_OINTMENT | Freq: Two times a day (BID) | CUTANEOUS | Status: AC

## 2014-11-30 MED ORDER — LIDOCAINE-EPINEPHRINE 2 %-1:100000 IJ SOLN
20.0000 mL | Freq: Once | INTRAMUSCULAR | Status: AC
  Administered 2014-11-30: 20 mL
  Filled 2014-11-30: qty 1

## 2014-11-30 MED ORDER — OXYCODONE-ACETAMINOPHEN 5-325 MG PO TABS: 1.0000 | ORAL_TABLET | Freq: Four times a day (QID) | ORAL | Status: AC | PRN

## 2014-11-30 NOTE — ED Provider Notes (Signed)
LACERATION REPAIR Performed by: Garald Balding Authorized by: Garald Balding Consent: Verbal consent obtained. Risks and benefits: risks, benefits and alternatives were discussed Consent given by: patient Patient identity confirmed: provided demographic data Prepped and Draped in normal sterile fashion Wound explored , irrigated with 250 cc of normal saline  Laceration Location: knee  Laceration Length: 1 cm  No Foreign Bodies seen or palpated  Anesthesia: local infiltration  Local anesthetic: lidocaine 1 without epinephrine  Anesthetic total: 20ml  Irrigation method: syringe Amount of cleaning: standard  Fascia closure  simple  Number of sutures: 6  Technique: Interrupted   Patient tolerance: Patient tolerated the procedure well with no immediate complications.  Junius Creamer, NP 11/30/14 [place], MD 11/30/14 [phone]
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
## 2014-11-30 NOTE — ED Notes (Signed)
Wet to Dry dressing applied to wound, wrapped in coban.

## 2014-12-31 ENCOUNTER — Encounter: Payer: Self-pay | Admitting: Gynecology

## 2015-01-17 ENCOUNTER — Ambulatory Visit: Payer: BC Managed Care – PPO | Admitting: Nurse Practitioner

## 2015-01-17 ENCOUNTER — Ambulatory Visit: Payer: BC Managed Care – PPO | Admitting: Gynecology

## 2015-06-25 ENCOUNTER — Other Ambulatory Visit: Payer: Self-pay | Admitting: Family

## 2015-06-25 DIAGNOSIS — R1012 Left upper quadrant pain: Secondary | ICD-10-CM

## 2015-07-02 ENCOUNTER — Other Ambulatory Visit: Payer: Self-pay

## 2015-07-11 ENCOUNTER — Ambulatory Visit
Admission: RE | Admit: 2015-07-11 | Discharge: 2015-07-11 | Disposition: A | Discharge status: Home / Self Care | Payer: BLUE CROSS/BLUE SHIELD | Source: Ambulatory Visit | Attending: Family | Admitting: Family

## 2015-07-11 DIAGNOSIS — R1012 Left upper quadrant pain: Secondary | ICD-10-CM

## 2016-07-21 IMAGING — US US ABDOMEN COMPLETE
1 series · 14 of 25 positions shown · non-contrast
Comparison: None.

CLINICAL DATA: Left upper quadrant pain, nausea, vomiting

EXAM:
ULTRASOUND ABDOMEN COMPLETE

[Series 1: us abdomen complete · 0.30mm/px · 14 of 90 slices shown]
[im 1/90]
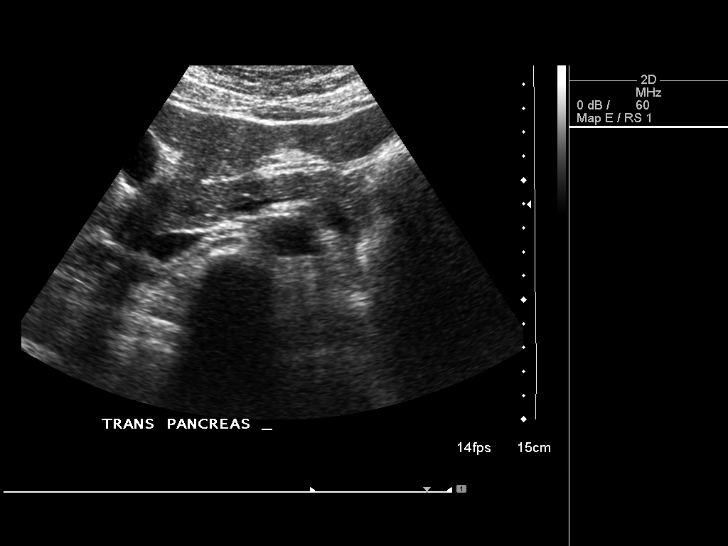
[im 8/90]
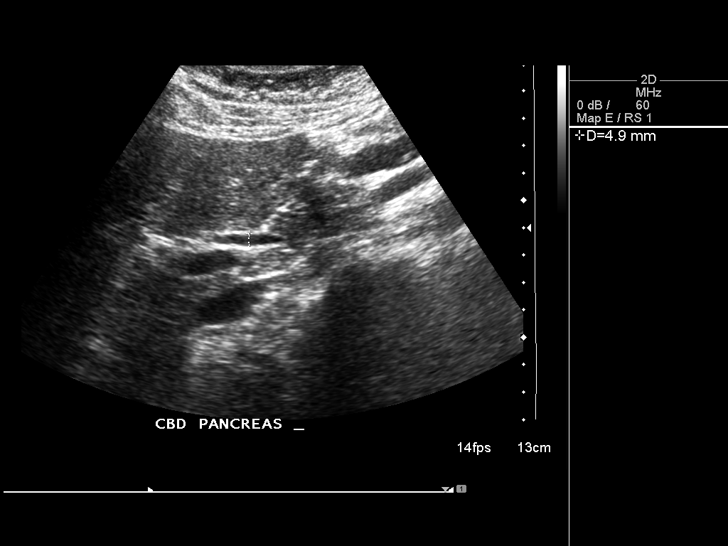
[im 15/90]
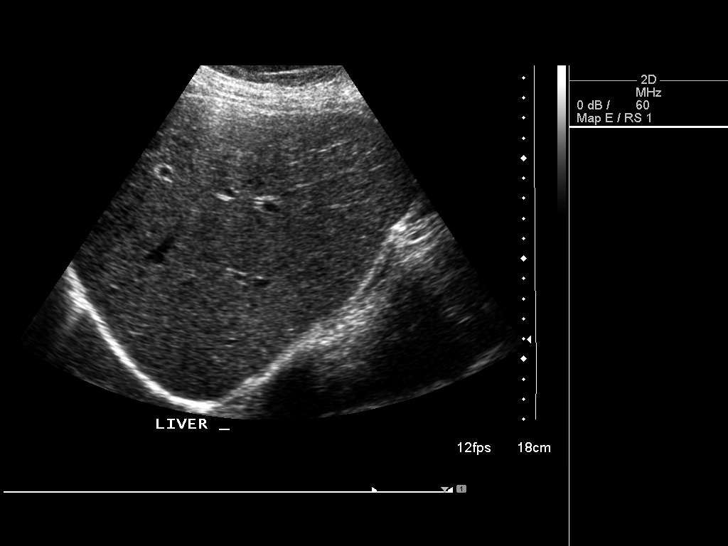
[im 23/90]
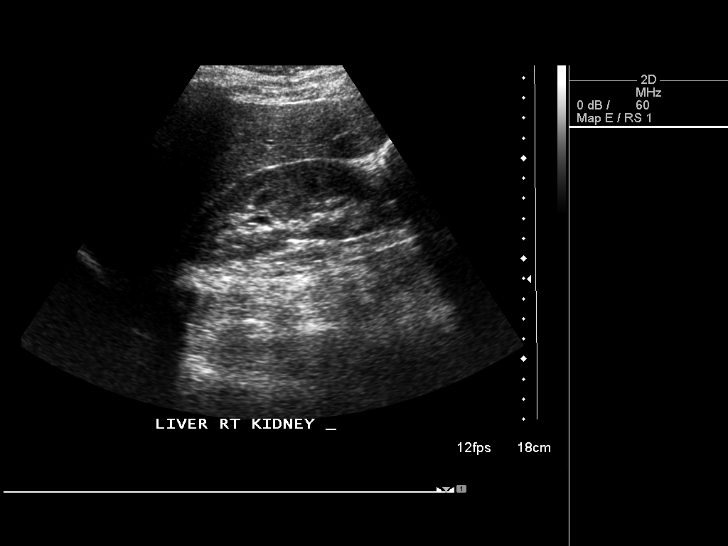
[im 30/90]
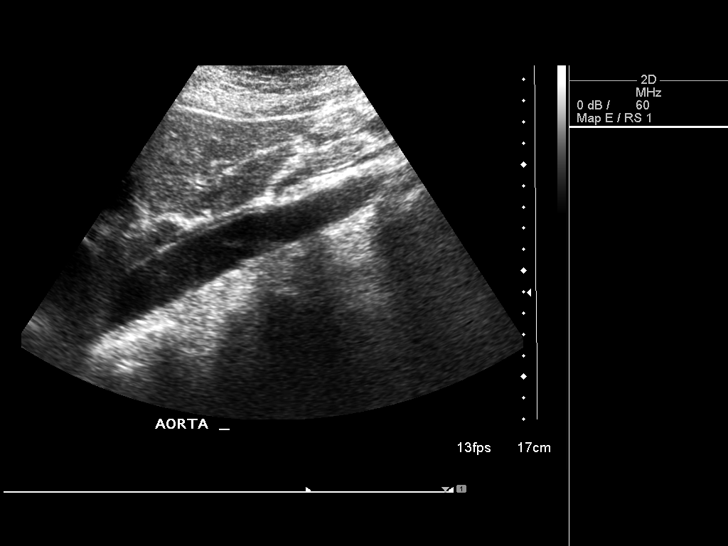
[im 34/90]
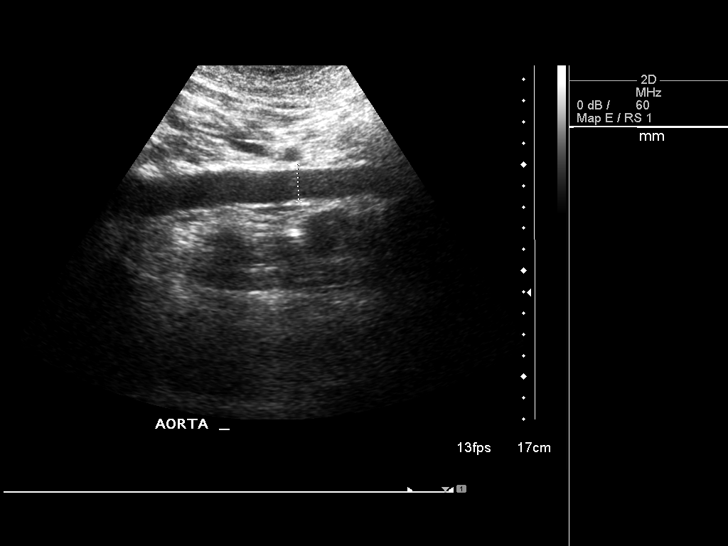
[im 41/90]
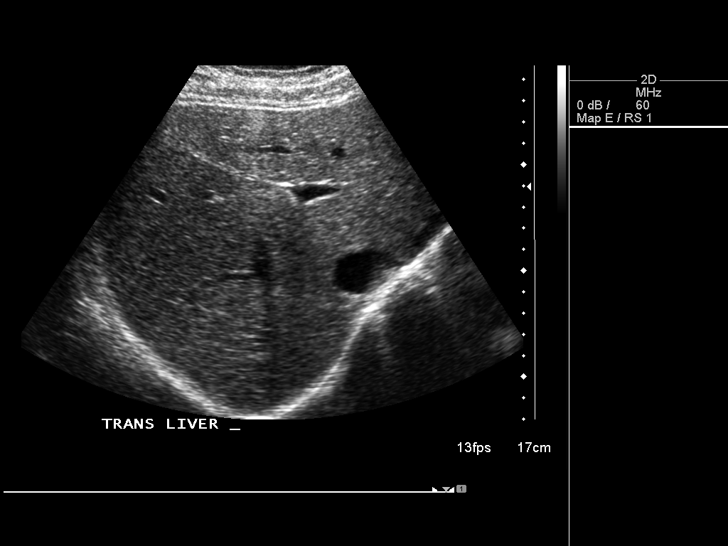
[im 49/90]
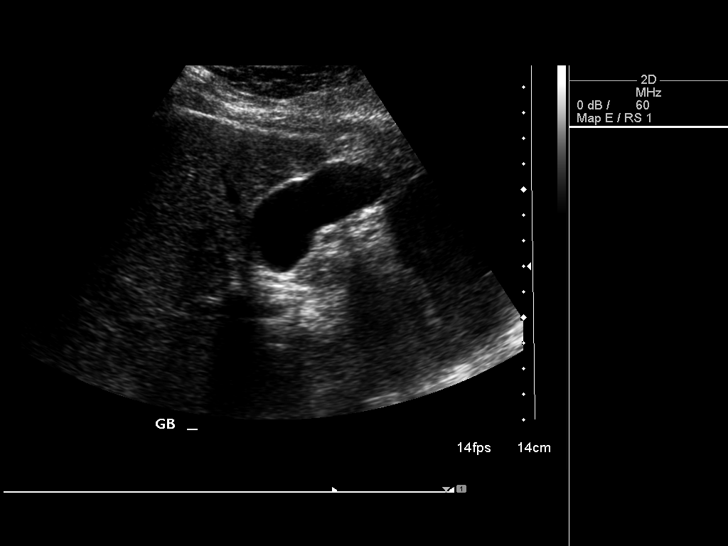
[im 56/90]
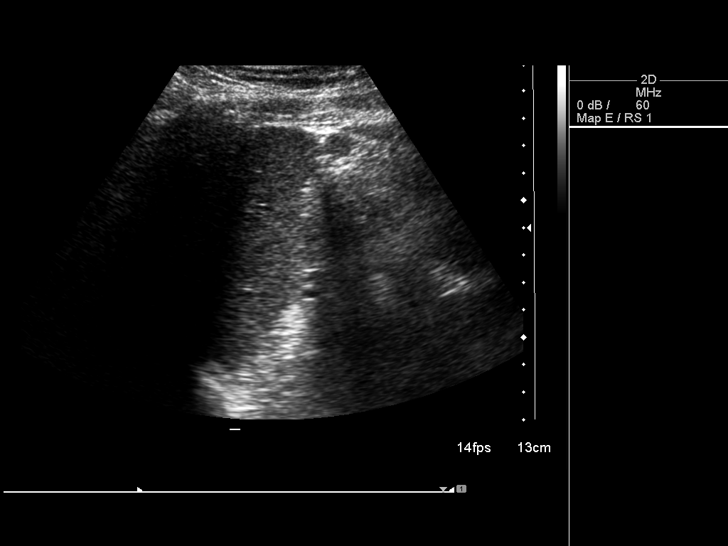
[im 60/90]
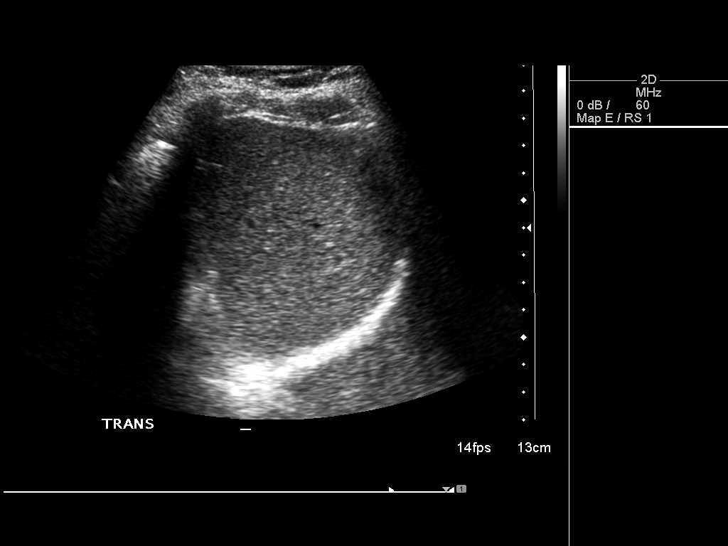
[im 67/90]
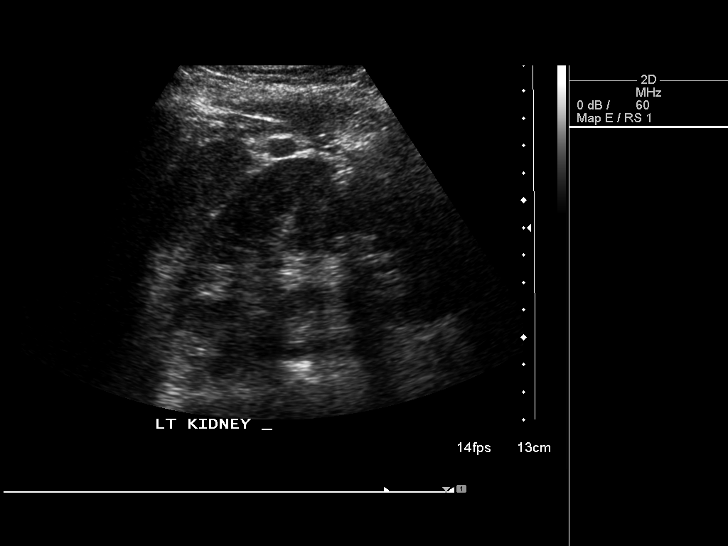
[im 75/90]
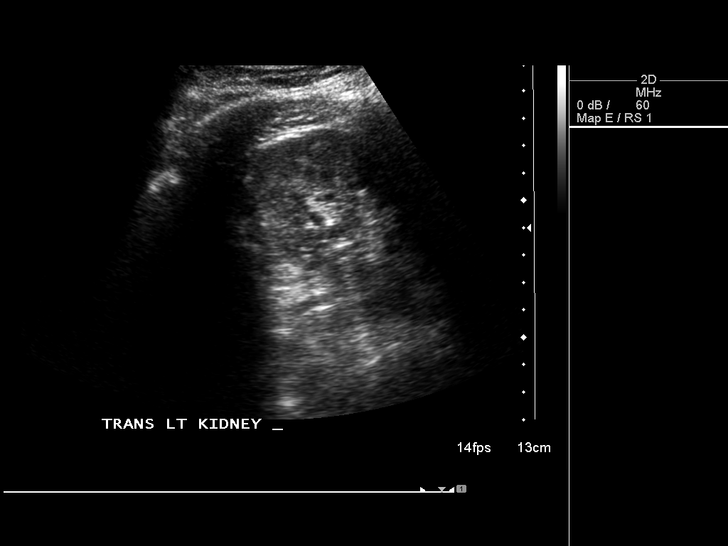
[im 82/90]
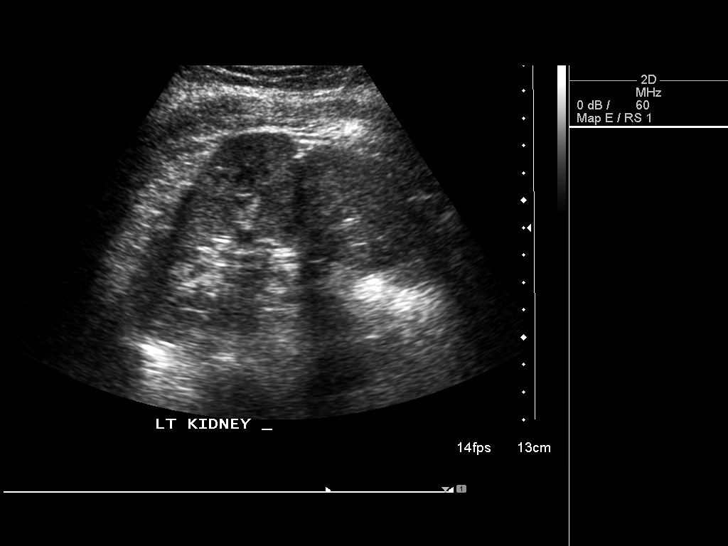
[im 90/90]
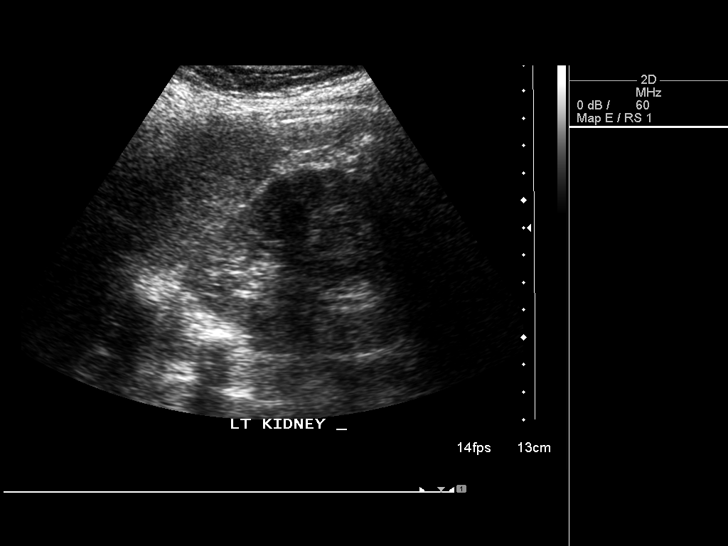

[14 of 25 positions shown; findings below may reference images not displayed]

FINDINGS: Gallbladder: No gallstones or wall thickening visualized. No
sonographic Murphy sign noted.

Common bile duct: Diameter: Normal caliber, 5 mm

Liver: No focal lesion identified. Within normal limits in
parenchymal echogenicity.

IVC: No abnormality visualized.

Pancreas: Visualized portion unremarkable.

Spleen: Size and appearance within normal limits.

Right Kidney: Length: 11.0 cm. Echogenicity within normal limits. No
mass or hydronephrosis visualized.

Left Kidney: Length: 11.8 cm. Mildly lobular contours. Echogenicity
within normal limits. No mass or hydronephrosis visualized.

Abdominal aorta: No aneurysm visualized.

Other findings: None.
IMPRESSION: Unremarkable abdominal ultrasound.

## 2020-10-01 ENCOUNTER — Other Ambulatory Visit: Payer: Self-pay

## 2020-10-01 ENCOUNTER — Encounter (HOSPITAL_COMMUNITY): Payer: Self-pay

## 2020-10-01 ENCOUNTER — Ambulatory Visit (HOSPITAL_COMMUNITY)
Admission: EM | Admit: 2020-10-01 | Discharge: 2020-10-01 | Disposition: A | Discharge status: Home / Self Care | Payer: BLUE CROSS/BLUE SHIELD | Attending: Emergency Medicine | Admitting: Emergency Medicine

## 2020-10-01 DIAGNOSIS — T7840XA Allergy, unspecified, initial encounter: Secondary | ICD-10-CM

## 2020-10-01 DIAGNOSIS — R030 Elevated blood-pressure reading, without diagnosis of hypertension: Secondary | ICD-10-CM

## 2020-10-01 DIAGNOSIS — R600 Localized edema: Secondary | ICD-10-CM

## 2020-10-01 MED ORDER — METHYLPREDNISOLONE SODIUM SUCC 125 MG IJ SOLR
INTRAMUSCULAR | Status: AC
  Filled 2020-10-01: qty 2

## 2020-10-01 MED ORDER — PREDNISONE 10 MG (21) PO TBPK: ORAL_TABLET | Freq: Every day | ORAL | 0 refills | Status: AC

## 2020-10-01 MED ORDER — DIPHENHYDRAMINE HCL 50 MG/ML IJ SOLN
INTRAMUSCULAR | Status: AC
  Filled 2020-10-01: qty 1

## 2020-10-01 MED ORDER — DIPHENHYDRAMINE HCL 50 MG/ML IJ SOLN
50.0000 mg | Freq: Once | INTRAMUSCULAR | Status: AC
  Administered 2020-10-01: 50 mg via INTRAMUSCULAR

## 2020-10-01 MED ORDER — METHYLPREDNISOLONE SODIUM SUCC 125 MG IJ SOLR
125.0000 mg | Freq: Once | INTRAMUSCULAR | Status: AC
  Administered 2020-10-01: 125 mg via INTRAMUSCULAR

## 2020-10-01 NOTE — Discharge Instructions (Addendum)
Call 911 and go to the emergency department if you have difficulty swallowing or breathing.    You were given an injection of a steroid called Solu-Medrol today.  You were also given an injection of Benadryl.    Continue to take Benadryl at home every 6 hours as directed.  Start the prednisone taper tomorrow.  Your blood pressure is elevated today at 155/108.  Please have this rechecked by your primary care provider in 1-2 weeks.

## 2020-10-01 NOTE — ED Triage Notes (Addendum)
Pt in with c/o allergic reaction and eye swelling that she noticed yesterday and has worsened overnight.  States she had her eyebrows done on Sunday evening then she noticed her eyebrows were itching and when she woke up the next day she noticed bumps.   Pt took benadryl and tylenol with no relief  Severe swelling to both eyes noted  Pt denies pain
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
## 2021-03-05 DIAGNOSIS — D649 Anemia, unspecified: Secondary | ICD-10-CM | POA: Diagnosis not present

## 2021-03-05 DIAGNOSIS — Z791 Long term (current) use of non-steroidal anti-inflammatories (NSAID): Secondary | ICD-10-CM | POA: Diagnosis not present

## 2021-03-05 DIAGNOSIS — I10 Essential (primary) hypertension: Secondary | ICD-10-CM | POA: Diagnosis not present

## 2021-03-05 DIAGNOSIS — Z1231 Encounter for screening mammogram for malignant neoplasm of breast: Secondary | ICD-10-CM | POA: Diagnosis not present

## 2021-03-05 DIAGNOSIS — M25561 Pain in right knee: Secondary | ICD-10-CM | POA: Diagnosis not present

## 2021-03-05 DIAGNOSIS — G8929 Other chronic pain: Secondary | ICD-10-CM | POA: Diagnosis not present

## 2021-04-16 DIAGNOSIS — Z124 Encounter for screening for malignant neoplasm of cervix: Secondary | ICD-10-CM | POA: Diagnosis not present

## 2021-04-16 DIAGNOSIS — Z1211 Encounter for screening for malignant neoplasm of colon: Secondary | ICD-10-CM | POA: Diagnosis not present

## 2021-04-16 DIAGNOSIS — I10 Essential (primary) hypertension: Secondary | ICD-10-CM | POA: Diagnosis not present

## 2021-04-16 DIAGNOSIS — F17200 Nicotine dependence, unspecified, uncomplicated: Secondary | ICD-10-CM | POA: Diagnosis not present

## 2021-04-16 DIAGNOSIS — Z0000 Encounter for general adult medical examination without abnormal findings: Secondary | ICD-10-CM | POA: Diagnosis not present

## 2021-09-17 DIAGNOSIS — I10 Essential (primary) hypertension: Secondary | ICD-10-CM | POA: Diagnosis not present

## 2021-09-17 DIAGNOSIS — F17200 Nicotine dependence, unspecified, uncomplicated: Secondary | ICD-10-CM | POA: Diagnosis not present

## 2021-09-17 DIAGNOSIS — M25561 Pain in right knee: Secondary | ICD-10-CM | POA: Diagnosis not present

## 2021-09-17 DIAGNOSIS — G8929 Other chronic pain: Secondary | ICD-10-CM | POA: Diagnosis not present

## 2021-12-10 DIAGNOSIS — I10 Essential (primary) hypertension: Secondary | ICD-10-CM | POA: Diagnosis not present

## 2021-12-10 DIAGNOSIS — F17200 Nicotine dependence, unspecified, uncomplicated: Secondary | ICD-10-CM | POA: Diagnosis not present

## 2022-02-04 DIAGNOSIS — K648 Other hemorrhoids: Secondary | ICD-10-CM | POA: Diagnosis not present

## 2022-02-04 DIAGNOSIS — K644 Residual hemorrhoidal skin tags: Secondary | ICD-10-CM | POA: Diagnosis not present

## 2022-02-04 DIAGNOSIS — K5730 Diverticulosis of large intestine without perforation or abscess without bleeding: Secondary | ICD-10-CM | POA: Diagnosis not present

## 2022-02-04 DIAGNOSIS — K635 Polyp of colon: Secondary | ICD-10-CM | POA: Diagnosis not present

## 2022-02-04 DIAGNOSIS — K621 Rectal polyp: Secondary | ICD-10-CM | POA: Diagnosis not present

## 2022-02-04 DIAGNOSIS — Z1211 Encounter for screening for malignant neoplasm of colon: Secondary | ICD-10-CM | POA: Diagnosis not present

## 2022-03-23 DIAGNOSIS — Z1231 Encounter for screening mammogram for malignant neoplasm of breast: Secondary | ICD-10-CM | POA: Diagnosis not present

## 2022-04-15 DIAGNOSIS — Z780 Asymptomatic menopausal state: Secondary | ICD-10-CM | POA: Diagnosis not present

## 2022-04-29 DIAGNOSIS — Z23 Encounter for immunization: Secondary | ICD-10-CM | POA: Diagnosis not present

## 2022-04-29 DIAGNOSIS — I10 Essential (primary) hypertension: Secondary | ICD-10-CM | POA: Diagnosis not present

## 2022-04-29 DIAGNOSIS — F17210 Nicotine dependence, cigarettes, uncomplicated: Secondary | ICD-10-CM | POA: Diagnosis not present

## 2022-04-29 DIAGNOSIS — Z0000 Encounter for general adult medical examination without abnormal findings: Secondary | ICD-10-CM | POA: Diagnosis not present

## 2022-04-29 DIAGNOSIS — E7800 Pure hypercholesterolemia, unspecified: Secondary | ICD-10-CM | POA: Diagnosis not present

## 2022-10-28 DIAGNOSIS — I10 Essential (primary) hypertension: Secondary | ICD-10-CM | POA: Diagnosis not present

## 2022-10-28 DIAGNOSIS — F17200 Nicotine dependence, unspecified, uncomplicated: Secondary | ICD-10-CM | POA: Diagnosis not present

## 2022-10-28 DIAGNOSIS — Z23 Encounter for immunization: Secondary | ICD-10-CM | POA: Diagnosis not present

## 2023-03-29 DIAGNOSIS — Z1231 Encounter for screening mammogram for malignant neoplasm of breast: Secondary | ICD-10-CM | POA: Diagnosis not present

## 2023-04-07 ENCOUNTER — Other Ambulatory Visit (HOSPITAL_BASED_OUTPATIENT_CLINIC_OR_DEPARTMENT_OTHER): Payer: Self-pay

## 2023-04-07 MED ORDER — FLULAVAL 0.5 ML IM SUSY
0.5000 mL | PREFILLED_SYRINGE | Freq: Once | INTRAMUSCULAR | 0 refills | Status: AC
  Filled 2023-04-07: qty 0.5, 1d supply, fill #0

## 2023-04-18 ENCOUNTER — Other Ambulatory Visit (HOSPITAL_BASED_OUTPATIENT_CLINIC_OR_DEPARTMENT_OTHER): Payer: Self-pay

## 2023-04-25 ENCOUNTER — Other Ambulatory Visit (HOSPITAL_COMMUNITY): Payer: Self-pay

## 2023-04-25 MED ORDER — CHLORHEXIDINE GLUCONATE 0.12 % MT SOLN
Freq: Two times a day (BID) | OROMUCOSAL | 0 refills | Status: AC
  Filled 2023-04-25: qty 473, 16d supply, fill #0

## 2023-05-10 ENCOUNTER — Other Ambulatory Visit: Payer: Self-pay

## 2023-05-10 ENCOUNTER — Other Ambulatory Visit (HOSPITAL_COMMUNITY): Payer: Self-pay

## 2023-05-10 MED ORDER — LOSARTAN POTASSIUM 50 MG PO TABS
50.0000 mg | ORAL_TABLET | Freq: Every day | ORAL | 3 refills | Status: DC
  Filled 2023-05-10: qty 90, 90d supply, fill #0
  Filled 2023-09-07: qty 90, 90d supply, fill #1
  Filled 2023-12-05: qty 90, 90d supply, fill #2
  Filled 2024-03-04: qty 90, 90d supply, fill #3

## 2023-05-10 MED ORDER — AMLODIPINE BESYLATE 5 MG PO TABS
5.0000 mg | ORAL_TABLET | Freq: Every day | ORAL | 3 refills | Status: DC
  Filled 2023-05-10: qty 90, 90d supply, fill #0
  Filled 2023-09-07: qty 90, 90d supply, fill #1
  Filled 2023-12-05: qty 90, 90d supply, fill #2
  Filled 2024-03-04: qty 90, 90d supply, fill #3

## 2023-05-10 MED ORDER — BUPROPION HCL ER (SR) 150 MG PO TB12
ORAL_TABLET | ORAL | 3 refills | Status: AC
  Filled 2023-05-10: qty 90, 47d supply, fill #0

## 2023-06-01 ENCOUNTER — Other Ambulatory Visit (HOSPITAL_COMMUNITY): Payer: Self-pay

## 2023-06-01 ENCOUNTER — Other Ambulatory Visit: Payer: Self-pay | Admitting: Physician Assistant

## 2023-06-01 DIAGNOSIS — F17200 Nicotine dependence, unspecified, uncomplicated: Secondary | ICD-10-CM

## 2023-06-01 DIAGNOSIS — I10 Essential (primary) hypertension: Secondary | ICD-10-CM | POA: Diagnosis not present

## 2023-06-01 DIAGNOSIS — Z0000 Encounter for general adult medical examination without abnormal findings: Secondary | ICD-10-CM | POA: Diagnosis not present

## 2023-06-01 DIAGNOSIS — F17210 Nicotine dependence, cigarettes, uncomplicated: Secondary | ICD-10-CM | POA: Diagnosis not present

## 2023-06-01 DIAGNOSIS — Z23 Encounter for immunization: Secondary | ICD-10-CM | POA: Diagnosis not present

## 2023-06-01 DIAGNOSIS — E7800 Pure hypercholesterolemia, unspecified: Secondary | ICD-10-CM | POA: Diagnosis not present

## 2023-06-01 DIAGNOSIS — F4321 Adjustment disorder with depressed mood: Secondary | ICD-10-CM | POA: Diagnosis not present

## 2023-06-01 MED ORDER — BUPROPION HCL ER (SR) 200 MG PO TB12
200.0000 mg | ORAL_TABLET | Freq: Two times a day (BID) | ORAL | 6 refills | Status: AC
  Filled 2023-06-01: qty 60, 30d supply, fill #0
  Filled 2023-07-24: qty 60, 30d supply, fill #1
  Filled 2023-09-07: qty 60, 30d supply, fill #2
  Filled 2023-11-09: qty 60, 30d supply, fill #3
  Filled 2023-12-06: qty 60, 30d supply, fill #4
  Filled 2024-01-26: qty 60, 30d supply, fill #5
  Filled 2024-03-04: qty 60, 30d supply, fill #6

## 2023-06-03 ENCOUNTER — Other Ambulatory Visit (HOSPITAL_COMMUNITY): Payer: Self-pay

## 2023-06-03 MED ORDER — ROSUVASTATIN CALCIUM 5 MG PO TABS
5.0000 mg | ORAL_TABLET | Freq: Every day | ORAL | 3 refills | Status: DC
  Filled 2023-06-03: qty 90, 90d supply, fill #0
  Filled 2023-09-07: qty 90, 90d supply, fill #1
  Filled 2023-12-05: qty 90, 90d supply, fill #2
  Filled 2024-03-04: qty 90, 90d supply, fill #3

## 2023-06-20 ENCOUNTER — Other Ambulatory Visit (HOSPITAL_COMMUNITY): Payer: Self-pay

## 2023-06-20 MED ORDER — OXYCODONE-ACETAMINOPHEN 5-325 MG PO TABS
1.0000 | ORAL_TABLET | Freq: Four times a day (QID) | ORAL | 0 refills | Status: AC | PRN
  Filled 2023-06-20: qty 28, 7d supply, fill #0

## 2023-06-20 MED ORDER — PROMETHAZINE HCL 25 MG PO TABS
25.0000 mg | ORAL_TABLET | ORAL | 0 refills | Status: AC | PRN
  Filled 2023-06-20: qty 5, 1d supply, fill #0

## 2023-06-20 MED ORDER — CEFADROXIL 500 MG PO CAPS
500.0000 mg | ORAL_CAPSULE | Freq: Two times a day (BID) | ORAL | 0 refills | Status: AC
  Filled 2023-06-20: qty 10, 5d supply, fill #0

## 2023-06-27 ENCOUNTER — Other Ambulatory Visit (HOSPITAL_COMMUNITY): Payer: Self-pay

## 2023-09-06 DIAGNOSIS — E7800 Pure hypercholesterolemia, unspecified: Secondary | ICD-10-CM | POA: Diagnosis not present

## 2023-09-06 DIAGNOSIS — Z5181 Encounter for therapeutic drug level monitoring: Secondary | ICD-10-CM | POA: Diagnosis not present

## 2023-11-04 ENCOUNTER — Other Ambulatory Visit (HOSPITAL_COMMUNITY): Payer: Self-pay

## 2023-11-04 MED ORDER — CHLORHEXIDINE GLUCONATE 0.12 % MT SOLN
15.0000 mL | Freq: Two times a day (BID) | OROMUCOSAL | 0 refills | Status: AC
  Filled 2023-11-04: qty 473, 16d supply, fill #0

## 2023-11-04 MED ORDER — AMOXICILLIN 500 MG PO CAPS
500.0000 mg | ORAL_CAPSULE | Freq: Three times a day (TID) | ORAL | 0 refills | Status: AC
  Filled 2023-11-04: qty 21, 7d supply, fill #0

## 2023-12-01 DIAGNOSIS — Z87891 Personal history of nicotine dependence: Secondary | ICD-10-CM | POA: Diagnosis not present

## 2023-12-01 DIAGNOSIS — I10 Essential (primary) hypertension: Secondary | ICD-10-CM | POA: Diagnosis not present

## 2023-12-01 DIAGNOSIS — E7800 Pure hypercholesterolemia, unspecified: Secondary | ICD-10-CM | POA: Diagnosis not present

## 2023-12-01 DIAGNOSIS — F17210 Nicotine dependence, cigarettes, uncomplicated: Secondary | ICD-10-CM | POA: Diagnosis not present

## 2023-12-01 DIAGNOSIS — F4320 Adjustment disorder, unspecified: Secondary | ICD-10-CM | POA: Diagnosis not present

## 2023-12-28 ENCOUNTER — Other Ambulatory Visit: Payer: Self-pay | Admitting: Physician Assistant

## 2023-12-28 DIAGNOSIS — F17210 Nicotine dependence, cigarettes, uncomplicated: Secondary | ICD-10-CM

## 2023-12-28 DIAGNOSIS — Z87891 Personal history of nicotine dependence: Secondary | ICD-10-CM

## 2024-01-30 ENCOUNTER — Other Ambulatory Visit (HOSPITAL_COMMUNITY): Payer: Self-pay

## 2024-04-03 DIAGNOSIS — Z1231 Encounter for screening mammogram for malignant neoplasm of breast: Secondary | ICD-10-CM | POA: Diagnosis not present

## 2024-04-20 ENCOUNTER — Other Ambulatory Visit (HOSPITAL_COMMUNITY): Payer: Self-pay

## 2024-04-20 MED ORDER — CHLORHEXIDINE GLUCONATE 0.12 % MT SOLN
15.0000 mL | Freq: Two times a day (BID) | OROMUCOSAL | 0 refills | Status: AC
  Filled 2024-04-20: qty 473, 16d supply, fill #0

## 2024-04-27 ENCOUNTER — Other Ambulatory Visit (HOSPITAL_BASED_OUTPATIENT_CLINIC_OR_DEPARTMENT_OTHER): Payer: Self-pay

## 2024-04-27 MED ORDER — FLUZONE 0.5 ML IM SUSY
0.5000 mL | PREFILLED_SYRINGE | Freq: Once | INTRAMUSCULAR | 0 refills | Status: AC
  Filled 2024-04-27: qty 0.5, 1d supply, fill #0

## 2024-06-04 ENCOUNTER — Other Ambulatory Visit (HOSPITAL_BASED_OUTPATIENT_CLINIC_OR_DEPARTMENT_OTHER): Payer: Self-pay

## 2024-06-04 DIAGNOSIS — Z131 Encounter for screening for diabetes mellitus: Secondary | ICD-10-CM | POA: Diagnosis not present

## 2024-06-04 DIAGNOSIS — B351 Tinea unguium: Secondary | ICD-10-CM | POA: Diagnosis not present

## 2024-06-04 DIAGNOSIS — Z0000 Encounter for general adult medical examination without abnormal findings: Secondary | ICD-10-CM | POA: Diagnosis not present

## 2024-06-04 DIAGNOSIS — D649 Anemia, unspecified: Secondary | ICD-10-CM | POA: Diagnosis not present

## 2024-06-04 DIAGNOSIS — I10 Essential (primary) hypertension: Secondary | ICD-10-CM | POA: Diagnosis not present

## 2024-06-04 DIAGNOSIS — E7800 Pure hypercholesterolemia, unspecified: Secondary | ICD-10-CM | POA: Diagnosis not present

## 2024-06-04 MED ORDER — AMLODIPINE BESYLATE 5 MG PO TABS
5.0000 mg | ORAL_TABLET | Freq: Every day | ORAL | 3 refills | Status: AC
  Filled 2024-06-04: qty 90, 90d supply, fill #0

## 2024-06-04 MED ORDER — ROSUVASTATIN CALCIUM 5 MG PO TABS
5.0000 mg | ORAL_TABLET | Freq: Every day | ORAL | 3 refills | Status: AC
  Filled 2024-06-04: qty 90, 90d supply, fill #0

## 2024-06-04 MED ORDER — CICLOPIROX 8 % EX SOLN
1.0000 | Freq: Every evening | CUTANEOUS | 0 refills | Status: DC
  Filled 2024-06-04: qty 6.6, 30d supply, fill #0

## 2024-06-04 MED ORDER — BLOOD PRESSURE MONITOR DEVI
0 refills | Status: AC
  Filled 2024-06-04: qty 1, 30d supply, fill #0

## 2024-06-04 MED ORDER — LOSARTAN POTASSIUM 50 MG PO TABS
50.0000 mg | ORAL_TABLET | Freq: Every day | ORAL | 3 refills | Status: AC
  Filled 2024-06-04: qty 90, 90d supply, fill #0
  Filled 2024-08-28: qty 90, 90d supply, fill #1

## 2024-07-24 ENCOUNTER — Other Ambulatory Visit (HOSPITAL_BASED_OUTPATIENT_CLINIC_OR_DEPARTMENT_OTHER): Payer: Self-pay

## 2024-07-27 ENCOUNTER — Encounter (HOSPITAL_BASED_OUTPATIENT_CLINIC_OR_DEPARTMENT_OTHER): Payer: Self-pay

## 2024-07-27 ENCOUNTER — Other Ambulatory Visit (HOSPITAL_BASED_OUTPATIENT_CLINIC_OR_DEPARTMENT_OTHER): Payer: Self-pay

## 2024-07-31 ENCOUNTER — Other Ambulatory Visit (HOSPITAL_BASED_OUTPATIENT_CLINIC_OR_DEPARTMENT_OTHER): Payer: Self-pay

## 2024-07-31 ENCOUNTER — Other Ambulatory Visit: Payer: Self-pay

## 2024-07-31 MED ORDER — CICLOPIROX 8 % EX SOLN
CUTANEOUS | 1 refills | Status: AC
  Filled 2024-07-31: qty 6.6, 30d supply, fill #0
  Filled 2024-08-28: qty 6.6, 30d supply, fill #1
